# Patient Record
Sex: Female | Born: 1995
Health system: Southern US, Community
[De-identification: ages and names within clinical notes are randomized; demographics above are authoritative.]

## PROBLEM LIST (undated history)

## (undated) DIAGNOSIS — G43909 Migraine, unspecified, not intractable, without status migrainosus: Secondary | ICD-10-CM

## (undated) DIAGNOSIS — B54 Unspecified malaria: Secondary | ICD-10-CM

## (undated) HISTORY — PX: WISDOM TOOTH EXTRACTION: SHX21

## (undated) HISTORY — DX: Unspecified malaria: B54

## (undated) HISTORY — DX: Migraine, unspecified, not intractable, without status migrainosus: G43.909

---
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
## 2015-03-05 ENCOUNTER — Ambulatory Visit: Payer: Self-pay | Admitting: Pediatrics

## 2015-03-12 ENCOUNTER — Encounter: Payer: Self-pay | Admitting: Pediatrics

## 2015-03-12 ENCOUNTER — Ambulatory Visit (INDEPENDENT_AMBULATORY_CARE_PROVIDER_SITE_OTHER): Payer: Federal, State, Local not specified - PPO | Admitting: Licensed Clinical Social Worker

## 2015-03-12 ENCOUNTER — Ambulatory Visit (INDEPENDENT_AMBULATORY_CARE_PROVIDER_SITE_OTHER): Payer: Federal, State, Local not specified - PPO | Admitting: Pediatrics

## 2015-03-12 ENCOUNTER — Encounter (INDEPENDENT_AMBULATORY_CARE_PROVIDER_SITE_OTHER): Payer: Self-pay

## 2015-03-12 VITALS — BP 116/70 | Ht 61.0 in | Wt 157.4 lb

## 2015-03-12 DIAGNOSIS — IMO0001 Reserved for inherently not codable concepts without codable children: Secondary | ICD-10-CM

## 2015-03-12 DIAGNOSIS — F439 Reaction to severe stress, unspecified: Secondary | ICD-10-CM

## 2015-03-12 DIAGNOSIS — R030 Elevated blood-pressure reading, without diagnosis of hypertension: Secondary | ICD-10-CM

## 2015-03-12 DIAGNOSIS — Z658 Other specified problems related to psychosocial circumstances: Secondary | ICD-10-CM | POA: Diagnosis not present

## 2015-03-12 DIAGNOSIS — E663 Overweight: Secondary | ICD-10-CM

## 2015-03-12 LAB — POCT URINALYSIS DIPSTICK
BILIRUBIN UA: NEGATIVE
GLUCOSE UA: NEGATIVE
Ketones, UA: NEGATIVE
Nitrite, UA: NEGATIVE
PH UA: 5
Protein, UA: NEGATIVE
SPEC GRAV UA: 1.02
Urobilinogen, UA: NEGATIVE

## 2015-03-12 NOTE — Patient Instructions (Addendum)
Melissa Perkins please call the nutrition department if you are interested in talking to a nutritionist about dietary changes to keep your blood pressure normal. Their number is: [phone].  DASH Eating Plan DASH stands for "Dietary Approaches to Stop Hypertension." The DASH eating plan is a healthy eating plan that has been shown to reduce high blood pressure (hypertension). Additional health benefits may include reducing the risk of type 2 diabetes mellitus, heart disease, and stroke. The DASH eating plan may also help with weight loss.  WHAT DO I NEED TO KNOW ABOUT THE DASH EATING PLAN? For the DASH eating plan, you will follow these general guidelines:  Choose foods with a percent daily value for sodium of less than 5% (as listed on the food label).  Use salt-free seasonings or herbs instead of table salt or sea salt.  Check with your health care provider or pharmacist before using salt substitutes.  Eat lower-sodium products, often labeled as "lower sodium" or "no salt added."  Eat fresh foods.  Eat more vegetables, fruits, and low-fat dairy products.  Choose whole grains. Look for the word "whole" as the first word in the ingredient list.  Choose fish and skinless chicken or Malaw[place]urkey more often than red meat. Limit fish, poultry, and meat to 6 oz (170 g) each day.  Limit sweets, desserts, sugars, and sugary drinks.  Choose heart-healthy fats.  Limit cheese to 1 oz (28 g) per day.  Eat more home-cooked food and less restaurant, buffet, and fast food.  Limit fried foods.  Cook foods using methods other than frying.  Limit canned vegetables. If you do use them, rinse them well to decrease the sodium.  When eating at a restaurant, ask that your food be prepared with less salt, or no salt if possible. WHAT FOODS CAN I EAT? Seek help from a dietitian for individual calorie needs. Grains Whole grain or whole wheat bread. Brown rice. Whole grain or whole wheat pasta. Quinoa, bulgur,  and whole grain cereals. Low-sodium cereals. Corn or whole wheat flour tortillas. Whole grain cornbread. Whole grain crackers. Low-sodium crackers. Vegetables Fresh or frozen vegetables (raw, steamed, roasted, or grilled). Low-sodium or reduced-sodium tomato and vegetable juices. Low-sodium or reduced-sodium tomato sauce and paste. Low-sodium or reduced-sodium canned vegetables.  Fruits All fresh, canned (in natural juice), or frozen fruits. Meat and Other Protein Products Ground beef (85% or leaner), grass-fed beef, or beef trimmed of fat. Skinless chicken or Mal[place]urkey. Ground chicken or [place]urkey. Pork trimmed of fat. All fish and seafood. Eggs. Dried beans, peas, or lentils. Unsalted nuts and seeds. Unsalted canned beans. Dairy Low-fat dairy products, such as skim or 1% milk, 2% or reduced-fat cheeses, low-fat ricotta or cottage cheese, or plain low-fat yogurt. Low-sodium or reduced-sodium cheeses. Fats and Oils Tub margarines without trans fats. Light or reduced-fat mayonnaise and salad dressings (reduced sodium). Avocado. Safflower, olive, or canola oils. Natural peanut or almond butter. Other Unsalted popcorn and pretzels. The items listed above may not be a complete list of recommended foods or beverages. Contact your dietitian for more options.   WHAT FOODS ARE NOT RECOMMENDED?  Grains White bread. White pasta. White rice. Refined cornbread. Bagels and croissants. Crackers that contain trans fat. Vegetables Creamed or fried vegetables. Vegetables in a cheese sauce. Regular canned vegetables. Regular canned tomato sauce and paste. Regular tomato and vegetable juices. Fruits Dried fruits. Canned fruit in light or heavy syrup. Fruit juice. Meat and Other Protein Products Fatty cuts of meat. Ribs, chicken wings, bacon, sausage,  bologna, salami, chitterlings, fatback, hot dogs, bratwurst, and packaged luncheon meats. Salted nuts and seeds. Canned beans with salt. Dairy Whole or 2% milk,  cream, half-and-half, and cream cheese. Whole-fat or sweetened yogurt. Full-fat cheeses or blue cheese. Nondairy creamers and whipped toppings. Processed cheese, cheese spreads, or cheese curds. Condiments Onion and garlic salt, seasoned salt, table salt, and sea salt. Canned and packaged gravies. Worcestershire sauce. Tartar sauce. Barbecue sauce. Teriyaki sauce. Soy sauce, including reduced sodium. Steak sauce. Fish sauce. Oyster sauce. Cocktail sauce. Horseradish. Ketchup and mustard. Meat flavorings and tenderizers. Bouillon cubes. Hot sauce. Tabasco sauce. Marinades. Taco seasonings. Relishes. Fats and Oils Butter, stick margarine, lard, shortening, ghee, and bacon fat. Coconut, palm kernel, or palm oils. Regular salad dressings. Other Pickles and olives. Salted popcorn and pretzels. The items listed above may not be a complete list of foods and beverages to avoid. Contact your dietitian for more information. WHERE CAN I FIND MORE INFORMATION? National Heart, Lung, and Blood Institute: CablePromo.itwww.nhlbi.nih.gov/health/health-topics/topics/dash/ Document Released: 10/01/2011 Document Revised: 02/26/2014 Document Reviewed: 08/16/2013 [facility] Patient Information 2015 [place], [place]. This information is not intended to replace advice given to you by your health care provider. Make sure you discuss any questions you have with your health care provider.

## 2015-03-12 NOTE — BH Specialist Note (Signed)
Referring Provider: Venia MinksSIMHA,SHRUTI VIJAYA, MD Session Time:  10:57 - 12:20 (23 min) Type of Service: Behavioral Health - Individual/Family Interpreter: No.  Interpreter Name & Language: NA   PRESENTING CONCERNS:  [name] is a 19 y.o. female brought in by father. [name] [facility] some anxious symptoms affecting her health including blood pressure. Additionally, Melissa Perkins is learning to drive and is going away to college in the fall and she reported some stress with these events.   GOALS ADDRESSED:  Increase knowledge of coping skills and problem solving skills Enhance ability to effectively cope with the full variety of life's anxieties     INTERVENTIONS:  Assessed current condition/needs Built rapport Solution Focused Therapy Observed parent-child interaction Stress managment    ASSESSMENT/OUTCOME:  Melissa Perkins is smiling and calm today. She admits stress when driving and with going away to college. Dad is in agreement and shared an insight about dad's role in stress while driving car. Praised for insight. SFT used to look for previous skills. Melissa Perkins tends to break problems down into smaller pieces and proceed methodically to accomplish goals. She easily applied this idea to learning to drive a car. Facilitated conversation with Melissa Perkins and her dad about the two of them driving together. Each was able to share their feelings. Practiced deep breathing, family stated relief.    PLAN:  Melissa Perkins will try relaxation strategies as needed. She can focus on one goal a week to improve driving without feeling overwhelmed (this week is slower, more gentle stops). Visualize this change in the car before you even start the car. Melissa Perkins can call as needed. She voiced agreement.   Scheduled next visit: None at this time.  Melissa Perkins Melissa Perkins Melissa Perkins LCSWA Behavioral Health Clinician [facility]

## 2015-03-12 NOTE — Progress Notes (Signed)
    Subjective:    Melissa Perkins is a 19 y.o. female accompanied by father presenting to the clinic today for follow up of obesity and hypertension.  Reviewed school BP readings for the past 2 months- range from 109/74- 139/81. There was one reading of 151/81 on 02/05/15. Melissa Perkins reports she was testing that day & was anxious. It seems like her BP is elevated every time she is anxious. She has not incorporated a lot of changes in her lifestyle though she reports that she is trying to eat more salads. She does not exercise. Her weight has increased from 155 lbs to 157 lbs in the past 3 months & she has not yet been seen by the nutritionist as her insurance has lapsed. Melissa Perkins will be graduating high school & starting college this Fall. She is going to [employer] & plans to purse environmental science. She is motivated to make changes in her diet & lifestyle as end of school year is approaching.  Review of Systems  Constitutional: Negative for fever, activity change and appetite change.  HENT: Negative for congestion.   Eyes: Negative for pain.  Respiratory: Negative for cough.   Gastrointestinal: Negative for abdominal pain.  Skin: Negative for rash.  Neurological: Negative for light-headedness and headaches.  Psychiatric/Behavioral: Negative for sleep disturbance.        Objective:   Physical Exam  Constitutional: She appears well-nourished. No distress.  HENT:  Head: Normocephalic and atraumatic.  Right Ear: External ear normal.  Left Ear: External ear normal.  Nose: Nose normal.  Mouth/Throat: Oropharynx is clear and moist.  Eyes: Conjunctivae and EOM are normal. Right eye exhibits no discharge. Left eye exhibits no discharge.  Neck: Normal range of motion.  Cardiovascular: Normal rate, regular rhythm and normal heart sounds.   Pulmonary/Chest: No respiratory distress. She has no wheezes. She has no rales.  Skin: Skin is warm and dry. No rash noted.  Nursing note and vitals  reviewed.  .BP 116/70 mmHg  Ht 5\' 1"  (1.549 m)  Wt 157 lb 6.4 oz (71.396 kg)  BMI 29.76 kg/m2        Assessment & Plan:  Elevated BP & overweight  BP normal today.  Detailed discussion regarding lifestyle changes. DASH diet discussed & offered nutrition referral once her insurance is reinstated. [facility] Leta SpellerLauren Preston to help with coping strategies for stress.  - POCT urinalysis dipstick- normal.  Melissa Perkins with follow up with student health for BP checks. RTC in 6 months for PE.  The visit lasted for 25 minutes and > 50% of the visit time was spent on counseling regarding the treatment plan and importance of compliance with chosen management options.  Return in about 6 months (around 09/12/2015) for Well child with Dr Wynetta EmerySimha.  Tobey BrideShruti Ahmar Pickrell, MD 03/16/2015 1:00 PM

## 2015-05-24 ENCOUNTER — Telehealth: Payer: Self-pay | Admitting: Pediatrics

## 2015-05-24 NOTE — Telephone Encounter (Signed)
Please call Melissa Perkins as soon forms are ready she has 2 sets of forms one for school and one for college @ [phone]

## 2015-05-24 NOTE — Telephone Encounter (Signed)
RN received forms for bother siblings. Forms documented on by RN and immunization records attached. Forms placed in Providers forms folder to be completed and signed by MD.

## 2015-05-27 NOTE — Telephone Encounter (Signed)
Forms picked up from completed folder, copies of originals made and left with Med Records to scan and originals delivered to front desk.

## 2015-05-27 NOTE — Telephone Encounter (Signed)
I called Mrs, Greggs spoked to her and let her know that her forms are ready for pick up

## 2015-06-11 ENCOUNTER — Ambulatory Visit (INDEPENDENT_AMBULATORY_CARE_PROVIDER_SITE_OTHER): Payer: Federal, State, Local not specified - PPO | Admitting: Pediatrics

## 2015-06-11 VITALS — BP 133/86 | HR 80 | Temp 98.4°F | Ht 60.63 in | Wt 157.2 lb

## 2015-06-11 DIAGNOSIS — R1013 Epigastric pain: Secondary | ICD-10-CM | POA: Diagnosis not present

## 2015-06-11 DIAGNOSIS — M25579 Pain in unspecified ankle and joints of unspecified foot: Secondary | ICD-10-CM

## 2015-06-11 DIAGNOSIS — Z113 Encounter for screening for infections with a predominantly sexual mode of transmission: Secondary | ICD-10-CM

## 2015-06-11 LAB — POCT URINE PREGNANCY: PREG TEST UR: NEGATIVE

## 2015-06-11 MED ORDER — OMEPRAZOLE 20 MG PO CPDR
20.0000 mg | DELAYED_RELEASE_CAPSULE | Freq: Every day | ORAL | Status: DC
Start: 2015-06-11 — End: 2016-09-01

## 2015-06-11 NOTE — Progress Notes (Signed)
History was provided by the patient.  Melissa Perkins is a 19 y.o. female who is here for abdominal pain/nausea and headaches.    HPI:  Pt reports she has been exercising recently to lose weight and over the weekend developed a headache while running at 2pm that recurred the following day. That has resolved and not returned.  She also has developed some nausea and upper belly pain that feels similar to being hungry but does not go away when she eats. It comes and goes but does not seem to be worsened or improved by anything. No treatments tried. Mom is worried because older brother recently had appendicitis. Patient often has some constipation but last BM yesterday was soft and easy to pass. No vomiting or diarrhea. Uses ibuprofen rarely. No alcohol or caffeine. No dysuria, frequency, urgency, vaginal discharge.   The following portions of the patient's history were reviewed and updated as appropriate: allergies, current medications, past family history, past medical history, past social history, past surgical history and problem list.  Physical Exam:  BP 133/86 mmHg  Pulse 80  Temp(Src) 98.4 F (36.9 C)  Ht 5' 0.63" (1.54 m)  Wt 157 lb 3.2 oz (71.305 kg)  BMI 30.07 kg/m2  LMP 05/20/2015 (Approximate)  Blood pressure percentiles are 99% systolic and 98% diastolic based on 2000 NHANES data.  Patient's last menstrual period was 05/20/2015 (approximate).    General:   alert, cooperative and no distress     Skin:   normal  Oral cavity:   lips, mucosa, and tongue normal; teeth and gums normal  Eyes:   sclerae white, pupils equal and reactive  Ears:   normal bilaterally  Nose: clear, no discharge  Neck:  Neck appearance: Normal  Lungs:  clear to auscultation bilaterally  Heart:   regular rate and rhythm, S1, S2 normal, no murmur, click, rub or gallop   Abdomen:  normal findings: bowel sounds normal, no masses palpable and no organomegaly and abnormal findings:  mild epigastric tenderness,  no rebound or guarding  GU:  not examined  Extremities:   extremities normal, atraumatic, no cyanosis or edema  Neuro:  normal without focal findings, mental status, speech normal, alert and oriented x3, PERLA and muscle tone and strength normal and symmetric    Assessment/Plan: Abdominal pain: concern for PUD or gastritis, mild and short duration (4 days) - trial of omeprazole prn - avoid nsaids - f/u if ongoing and would consider H. pylori testing  - will check upreg and gc/ct although suspicion is low given location of discomfort and lack of associated GU symptoms  - Immunizations today: none  - Follow-up visit in 2 months for [facility], or sooner as needed.    Beverely Low, MD  06/11/2015

## 2015-06-11 NOTE — Patient Instructions (Signed)
Gastritis, Child °Stomachaches in children may come from gastritis. This is a soreness (inflammation) of the stomach lining. It can either happen suddenly (acute) or slowly over time (chronic). A stomach or duodenal ulcer may be present at the same time. °CAUSES  °Gastritis is often caused by an infection of the stomach lining by a bacteria called Helicobacter Pylori. (H. Pylori.) This is the usual cause for primary (not due to other cause) gastritis. Secondary (due to other causes) gastritis may be due to: °· Medicines such as aspirin, ibuprofen, steroids, iron, antibiotics and others. °· Poisons. °· Stress caused by severe burns, recent surgery, severe infections, trauma, etc. °· Disease of the intestine or stomach. °· Autoimmune disease (where the body's immune system attacks the body). °· Sometimes the cause for gastritis is not known. °SYMPTOMS  °Symptoms of gastritis in children can differ depending on the age of the child. School-aged children and adolescents have symptoms similar to an adult: °· Belly pain - either at the top of the belly or around the belly button. This may or may not be relieved by eating. °· Nausea (sometimes with vomiting). °· Indigestion. °· Decreased appetite. °· Feeling bloated. °· Belching. °Infants and young children may have: °· Feeding problems or decreased appetite. °· Unusual fussiness. °· Vomiting. °In severe cases, a child may vomit red blood or coffee colored digested blood. Blood may be passed from the rectum as bright red or black stools. °DIAGNOSIS  °There are several tests that your child's caregiver may do to make the diagnosis.  °· Tests for H. Pylori. (Breath test, blood test or stomach biopsy) °· A small tube is passed through the mouth to view the stomach with a tiny camera (endoscopy). °· Blood tests to check causes or side effects of gastritis. °· Stool tests for blood. °· Imaging (may be done to be sure some other disease is not present) °TREATMENT  °For gastritis  caused by H. Pylori, your child's caregiver may prescribe one of several medicine combinations. A common combination is called triple therapy (2 antibiotics and 1 proton pump inhibitor (PPI). PPI medicines decrease the amount of stomach acid produced). Other medicines may be used such as: °· Antacids. °· H2 blockers to decrease the amount of stomach acid. °· Medicines to protect the lining of the stomach. °For gastritis not caused by H. Pylori, your child's caregiver may: °· Use H2 blockers, PPI's, antacids or medicines to protect the stomach lining. °· Remove or treat the cause (if possible). °HOME CARE INSTRUCTIONS  °· Use all medicine exactly as directed. Take them for the full course even if everything seems to be better in a few days. °· Helicobacter infections may be re-tested to make sure the infection has cleared. °· Continue all current medicines. Only stop medicines if directed by your child's caregiver. °· Avoid caffeine. °SEEK MEDICAL CARE IF:  °· Problems are getting worse rather than better. °· Your child develops black tarry stools. °· Problems return after treatment. °· Constipation develops. °· Diarrhea develops. °SEEK IMMEDIATE MEDICAL CARE IF: °· Your child vomits red blood or material that looks like coffee grounds. °· Your child is lightheaded or blacks out. °· Your child has bright red stools. °· Your child vomits repeatedly. °· Your child has severe belly pain or belly tenderness to the touch - especially with fever. °· Your child has chest pain or shortness of breath. °Document Released: 12/21/2001 Document Revised: 01/04/2012 Document Reviewed: 06/18/2013 °ExitCare® Patient Information ©2015 ExitCare, LLC. This information is not   intended to replace advice given to you by your health care provider. Make sure you discuss any questions you have with your health care provider. ° °

## 2015-06-12 LAB — GC/CHLAMYDIA PROBE AMP, URINE
CHLAMYDIA, SWAB/URINE, PCR: NEGATIVE
GC PROBE AMP, URINE: NEGATIVE

## 2015-08-14 ENCOUNTER — Ambulatory Visit: Payer: Federal, State, Local not specified - PPO | Admitting: Pediatrics

## 2016-01-23 ENCOUNTER — Encounter: Payer: Self-pay | Admitting: Pediatrics

## 2016-01-23 ENCOUNTER — Ambulatory Visit (INDEPENDENT_AMBULATORY_CARE_PROVIDER_SITE_OTHER): Payer: Federal, State, Local not specified - PPO | Admitting: Pediatrics

## 2016-01-23 VITALS — BP 115/70 | Ht 60.5 in | Wt 159.8 lb

## 2016-01-23 DIAGNOSIS — Z68.41 Body mass index (BMI) pediatric, 85th percentile to less than 95th percentile for age: Secondary | ICD-10-CM

## 2016-01-23 DIAGNOSIS — Z113 Encounter for screening for infections with a predominantly sexual mode of transmission: Secondary | ICD-10-CM | POA: Diagnosis not present

## 2016-01-23 DIAGNOSIS — Z0001 Encounter for general adult medical examination with abnormal findings: Secondary | ICD-10-CM

## 2016-01-23 DIAGNOSIS — E663 Overweight: Secondary | ICD-10-CM | POA: Diagnosis not present

## 2016-01-23 DIAGNOSIS — R6889 Other general symptoms and signs: Secondary | ICD-10-CM

## 2016-01-23 NOTE — Patient Instructions (Addendum)
Kirsten you can return to see our adolescent specialist if you would like to consider contraception. I would also encourage you to start looking for a family practice or internal medicine doctor for your primary care  as we can see teens only until 21 yrs.   Websites for Teens  General www.youngwomenshealth.org www.youngmenshealthsite.org www.teenhealthfx.com www.teenhealth.org www.healthychildren.org  Sexual and Reproductive Health www.bedsider.org www.seventeendays.org www.plannedparenthood.org www.StrengthHappens.sisexetc.org www.girlology.com  Relaxation & Meditation Apps for Teens Mindshift StopBreatheThink Relax & Rest Smiling Mind Calm Headspace Take A Chill Kids Feeling SAM Freshmind Yoga By [employer] Kids Yogaverse  Teens need about 9 hours of sleep a night. Younger children need more sleep (10-11 hours a night) and adults need slightly less (7-9 hours each night).  11 Tips to Follow:  1. No caffeine after 3pm: Avoid beverages with caffeine (soda, tea, energy drinks, etc.) especially after 3pm. 2. Don't go to bed hungry: Have your evening meal at least 3 hrs. before going to sleep. It's fine to have a small bedtime snack such as a glass of milk and a few crackers but don't have a big meal. 3. Have a nightly routine before bed: Plan on "winding down" before you go to sleep. Begin relaxing about 1 hour before you go to bed. Try doing a quiet activity such as listening to calming music, reading a book or meditating. 4. Turn off the TV and ALL electronics including video games, tablets, laptops, etc. 1 hour before sleep, and keep them out of the bedroom. 5. Turn off your cell phone and all notifications (new email and text alerts) or even better, leave your phone outside your room while you sleep. Studies have shown that a part of your brain continues to respond to certain lights and sounds even while you're still asleep. 6. Make your bedroom quiet, dark and cool. If you can't control the  noise, try wearing earplugs or using a fan to block out other sounds. 7. Practice relaxation techniques. Try reading a book or meditating or drain your brain by writing a list of what you need to do the next day. 8. Don't nap unless you feel sick: you'll have a better night's sleep. 9. Don't smoke, or quit if you do. Nicotine, alcohol, and marijuana can all keep you awake. Talk to your health care provider if you need help with substance use. 10. Most importantly, wake up at the same time every day (or within 1 hour of your usual wake up time) EVEN on the weekends. A regular wake up time promotes sleep hygiene and prevents sleep problems. 11. Reduce exposure to bright light in the last three hours of the day before going to sleep. Maintaining good sleep hygiene and having good sleep habits lower your risk of developing sleep problems. Getting better sleep can also improve your concentration and alertness. Try the simple steps in this guide. If you still have trouble getting enough rest, make an appointment with your health care provider.

## 2016-01-23 NOTE — Progress Notes (Signed)
Adolescent Well Care Visit Melissa Perkins is a 20 y.o. female who is here for well care.    PCP:  Melissa MinksSIMHA,Tionna Gigante VIJAYA, MD   History was provided by the patient.  Current Issues: Current concerns include Doing well, no concerns [employer]man in [employer]. College has been very busy. She lives in a dorm on campus. No current health issues. She has checked her BP off & on & it has been normal. Labs in 2015 were wnl expect for borderline low Vit D- not taking vitamins .   Nutrition: Nutrition/Eating Behaviors: Attempts to cook when possible but mostly eats outside/cafeteria Adequate calcium in diet?: No Supplements/ Vitamins: No  Exercise/ Media: Play any Sports?/ Exercise: None Screen Time:  < 2 hours Media Rules or Monitoring?: no  Sleep:  Sleep: 5-6 hrs only at night due to school assignments  Social Screening: Lives alone in a dorm on school campus Parental relations:  good Activities, Work, and Regulatory affairs officerChores?: extremely busy with college Concerns regarding behavior with peers?  no Stressors of note: yes - lack of sleep & work load  Education: [employer]. Environmnetal science. Plans to transfer to [employer] or [employer] if possible for Public health School performance: doing well; no concerns  Menstruation:   Patient's last menstrual period was 01/01/2016. Menstrual History: Regular cycles every 30 days  Confidentiality was discussed with the patient and, if applicable, with caregiver as well.   Tobacco?  no Secondhand smoke exposure?  no Drugs/ETOH?  no  Sexually Active?  no   Pregnancy Prevention: abstinence. Pt is not interested in any form of contraception & strongly feels that she is responsible & when she decides  To become sexually active she will opt for birth control. She is not currently in a relationship & has never been sexually active.  Safe at home, in school & in relationships?  Yes Safe to self?  Yes   Screenings: Patient has a  dental home: yes  The patient completed the Rapid Assessment for Adolescent Preventive Services screening questionnaire and the following topics were identified as risk factors and discussed: healthy eating, exercise, condom use and birth control  In addition, the following topics were discussed as part of anticipatory guidance tobacco use, marijuana use and drug use.  PHQ-9 completed and results indicated negative screen  Physical Exam:  Filed Vitals:   01/23/16 0917  BP: 115/70  Height: 5' 0.5" (1.537 m)  Weight: 159 lb 12.8 oz (72.485 kg)   BP 115/70 mmHg  Ht 5' 0.5" (1.537 m)  Wt 159 lb 12.8 oz (72.485 kg)  BMI 30.68 kg/m2  LMP 01/01/2016 Body mass index: body mass index is 30.68 kg/(m^2). Blood pressure percentiles are 78% systolic and 75% diastolic based on 2000 NHANES data. Blood pressure percentile targets: 90: 120/77, 95: 124/81, 99 + 5 mmHg: 136/93.   Hearing Screening   Method: Audiometry   125Hz  250Hz  500Hz  1000Hz  2000Hz  4000Hz  8000Hz   Right ear:   20 20 20 20    Left ear:   20 20 20 20      Visual Acuity Screening   Right eye Left eye Both eyes  Without correction:     With correction: 20/20 20/20 20/20     General Appearance:   alert, oriented, no acute distress  HENT: Normocephalic, no obvious abnormality, conjunctiva clear  Mouth:   Normal appearing teeth, no obvious discoloration, dental caries, or dental caps  Neck:   Supple; thyroid: no enlargement, symmetric, no tenderness/mass/nodules  Chest Breast  if female: 5  Lungs:   Clear to auscultation bilaterally, normal work of breathing  Heart:   Regular rate and rhythm, S1 and S2 normal, no murmurs;   Abdomen:   Soft, non-tender, no mass, or organomegaly  GU normal female external genitalia, pelvic not performed  Musculoskeletal:   Tone and strength strong and symmetrical, all extremities               Lymphatic:   No cervical adenopathy  Skin/Hair/Nails:   Skin warm, dry and intact, no rashes, no bruises or  petechiae  Neurologic:   Strength, gait, and coordination normal and age-appropriate     Assessment and Plan:   20 y/o female for well visit Overweight  Detailed dietary advice/lifestyle changes discussed. 5210 Sleep hygiene discussed  Discussed options for contraception in detail but [name] declined for now.  BMI is not appropriate for age  Hearing screening result:normal Vision screening result: normal   Orders Placed This Encounter  Procedures  . GC/Chlamydia Probe Amp    Discussed transition to family practice/adult medicine.  Return in 1 year (on 01/22/2017).Melissa Minks, MD

## 2016-01-24 DIAGNOSIS — Z68.41 Body mass index (BMI) pediatric, 85th percentile to less than 95th percentile for age: Secondary | ICD-10-CM | POA: Insufficient documentation

## 2016-01-24 LAB — GC/CHLAMYDIA PROBE AMP
CT Probe RNA: NOT DETECTED
GC PROBE AMP APTIMA: NOT DETECTED

## 2016-09-01 ENCOUNTER — Encounter: Payer: Self-pay | Admitting: Pediatrics

## 2016-09-01 ENCOUNTER — Ambulatory Visit: Payer: Federal, State, Local not specified - PPO | Admitting: Pediatrics

## 2016-09-01 ENCOUNTER — Ambulatory Visit (INDEPENDENT_AMBULATORY_CARE_PROVIDER_SITE_OTHER): Payer: Federal, State, Local not specified - PPO | Admitting: Pediatrics

## 2016-09-01 ENCOUNTER — Ambulatory Visit (INDEPENDENT_AMBULATORY_CARE_PROVIDER_SITE_OTHER): Payer: Federal, State, Local not specified - PPO | Admitting: Clinical

## 2016-09-01 VITALS — BP 144/80 | Temp 98.9°F | Wt 157.4 lb

## 2016-09-01 DIAGNOSIS — R030 Elevated blood-pressure reading, without diagnosis of hypertension: Secondary | ICD-10-CM | POA: Diagnosis not present

## 2016-09-01 DIAGNOSIS — R69 Illness, unspecified: Secondary | ICD-10-CM

## 2016-09-01 DIAGNOSIS — F439 Reaction to severe stress, unspecified: Secondary | ICD-10-CM | POA: Diagnosis not present

## 2016-09-01 DIAGNOSIS — G44229 Chronic tension-type headache, not intractable: Secondary | ICD-10-CM

## 2016-09-01 NOTE — BH Specialist Note (Signed)
Session Start time: 12:23   End Time: 1:08 Total Time:  45 mins Type of Service: Behavioral Health - Individual/Family Interpreter: No.   Interpreter Name & LanguageGretta Cool: n/a [facility] July [facility]-June 2018: [facility]st   SUBJECTIVE: [name] is a 20 y.o. female brought in by self.  Pt./Family was referred by Dr. Kennedy BuckerGrant for:  anxiety. Pt./Family reports the following symptoms/concerns: Pt reports feeling anxiety and stress around school, home life, and work. Pt reports feeling overwhelmed at times, having diffculty concentrating, and having a difficult relationship with her mom. Pt feels disconnected from her peers, and reports feeling concerned about her school performance Duration of problem:  Has felt stress since she can remember, feelings have intensified since starting college about a year and a half ago Severity: pt reports on the border between moderate and severe, citing not being able to focus on herself and her life, as stress is impacting the things she wants to be doing Previous treatment: Pt reports talking to her brothers for advice, praying, going shopping, and other activities to distract herself from stressors. Is also interested in the counseling center at her college.   OBJECTIVE: Mood: Anxious and Euthymic & Affect: Appropriate Risk of harm to self or others: No, reports wondering why she is here, and what it would be like to not be here, but has no plan or intent Assessments administered: None, in the future, screen for depression, anxiety, panic disorder  LIFE CONTEXT:  Family & Social: Pt reports having recently been ina fight with mom, arguing about mom's behaviors. Pt reports a close relationship with her brothers and sister. Pt reports having associates at school, and some close friends at church that she doesn't get to see very often due to being at work on Sunday.  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 Works part time at new job of about a semester  Self-Care: No concerns reported about sleeping or eating, often uses sleep as a way to feel better when feeling stress. Reports no substance use  Life changes: Started college in the last few years, started a new job for the first time  What is important to pt/family (values): It is important to pt to focus on herself and her goals. Academic success is also important to pt.   GOALS ADDRESSED:  Assess barriers to social emotional development Reduce overall frequency, intensity, and duration of the anxiety so that daily functioning is not impaired Elevate mood and show evidence of usual energy, activities, and socialization level   INTERVENTIONS: Strength-based, Supportive, Reframing and Other: including  Build rapport Stress management Discussed Integrated Care Grounding technique   ASSESSMENT:  Pt/Family currently experiencing increased stress and anxiety in several areas of her life. Pt reports feeling stress around her home life, which is impacting her school performance, which causes her more stress. Pt is experiencing stress which she identifies is coming from several activities and much of the stress seems to be coming from her strained relationship with her mom.   Pt also reports symptoms similar to that of a panic or anxiety attack.   Pt/Family may benefit from further screening and investigation to help develop insight into pts experience. Pt may also benefit from continued reflective talk therapy and coping strategies from this Clinical research associatewriter. Pt would benefit from implementing grounding coping strategy when she begins feeling out of control or cannot focus. Pt  may also benefit from following up with her questions and interest in the counseling center on her college campus.      PLAN: 1. F/U with behavioral health clinician: 09/08/16 2. Behavioral recommendations: Implement coping  strategies when feeling unable to focus, overwhelmed, or stressed 3. Referral: Supportive Counseling on her college campus 4. From scale of 1-10, how likely are you to follow plan: 10, pt reports being really interested and motivated to make changes and learn skills to make herself feel better   Tim LairHannah Moore Behavioral Health Intern  Marlon PelWarmhandoff:   Warm Hand Off Completed.      I reviewed [facility] with [facility]. I concur with the treatment plan as documented in the [facility] note.  No charge for this visit due to [facility] completing [facility] visit.   Jasmine P. Mayford KnifeWilliams, MSW, LCSW Lead Behavioral Health Clinician

## 2016-09-01 NOTE — Progress Notes (Signed)
History was provided by the patient.  Melissa Perkins is a 20 y.o. female who is here for acute visit due to headache and concern for elevated blood pressure.      HPI:   Complaining of headache for the past 5 days- frontal in nature and throbbing; intermittent and associated with phonophobia and nausea.  Denies any neck pain, diplopia, vomiting, fever or recent illness. Has used Tylenol with some relief. Headaches seem to be related to stress.  Patient identifies stressors of school- exams coming up and with family- Mom needed help with younger siblings.  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  She is now living back at home but previously lived on campus.  Has headaches 3 per month but during exams and high stress times thinks she may have as many as 15 per month. Has never kept track of them.  Is also concerned that stress and headaches are causing increased blood pressure for her.   Periods have been regular and non heavy.   Denies any family history of HTN or migraines or renovascular disease.    Melissa Perkins has seen BH in past and identifies today that she may be in need of counseling to control some of her symptoms.  Review of Systems  Constitutional: Negative for chills, fever, malaise/fatigue and weight loss.  HENT: Negative for congestion, ear pain, sinus pain and sore throat.        Phonphobia  Eyes: Negative for blurred vision, double vision and photophobia.  Respiratory: Negative for cough and shortness of breath.   Cardiovascular: Negative for chest pain and palpitations.  Gastrointestinal: Positive for nausea. Negative for abdominal pain, constipation, diarrhea and vomiting.  Genitourinary: Negative for dysuria and flank pain.  Musculoskeletal: Negative for back pain, myalgias and neck pain.  Neurological: Positive for headaches. Negative for dizziness, focal weakness, seizures, loss of consciousness and  weakness.  Psychiatric/Behavioral: The patient is nervous/anxious.        Stress    The following portions of the patient's history were reviewed and updated as appropriate: allergies, current medications, past family history, past medical history, past social history, past surgical history and problem list.  Physical Exam:  BP (!) 144/80   Temp 98.9 F (37.2 C) (Temporal)   Wt 157 lb 6.4 oz (71.4 kg)   BMI 30.23 kg/m   Vitals with Age-Percentiles 09/01/2016 01/23/2016 06/11/2015 03/12/2015  Height percentile  6.9 7.8 10.2  Systolic percentile  77.8 99.1 ((!)) 75.9  Diastolic percentile  74.5 97.8 ((!)) 69.9  Length  153.7 cm 154 cm 154.9 cm  Systolic 144 115 454133 116  Diastolic 80 70 86 70  Pulse   80   Respiration      Weight 71.396 kg 72.485 kg 71.305 kg 71.396 kg  BMI  30.8 30.1 29.8  VISIT REPORT       Vitals with Age-Percentiles 11/27/2014 11/27/2014 07/31/2014 03/24/2013  Height percentile  6.8 5.1 11.4  Systolic percentile 70.1 96.9 ((!)) 96.9 ((!)) 97.6 ((!))  Diastolic percentile 85.4 89.2 09.891.9 67.1  Length  153.5 cm 152.5 cm 154.9 cm  Systolic 114 128 119128 130  Diastolic 76 78 80 70  Pulse    72  Respiration    16  Weight  70.308 kg 69.31 kg 68.04 kg  BMI  29.9 29.9 28.4  VISIT REPORT       Vitals with Age-Percentiles 12/06/2012 08/30/2012 05/26/2012 01/18/2012  Height percentile  5.9  6.7  Systolic percentile  98.8 ((!))  98.2 ((!))  Diastolic percentile  88.7  81.0  Length  152.4 cm  152.4 cm  Systolic 140 132 409120 130  Diastolic 82 78 90 74  Pulse 80 91 84 80  Respiration 16  16 16   Weight 64.411 kg 60.782 kg 62.143 kg 66.679 kg  BMI  26.2  28.8  VISIT REPORT       Vitals with Age-Percentiles 12/17/2009  Height percentile 33.0  Systolic percentile 94.7  Diastolic percentile 52.3  Length 154.9 cm  Systolic 124  Diastolic 64  Pulse 86  Respiration   Weight 57.153 kg  BMI 23.9  VISIT REPORT    General:  Alert, cooperative, no distress Head:  Normocephalic  with no palpable pain.  Eyes:  PERRL, conjunctivae clear, both eyes, vessels seen on exam without photophobia.  Ears:  Normal TMs and external ear canals, both ears Nose:  Nares normal, no drainage Throat: Oropharynx pink, moist, benign Neck:  Supple Chest Wall: No tenderness or deformity Cardiac: Regular rate and rhythm, S1 and S2 normal, no murmur, rub or gallop, 2+ radial and distal pulses Lungs: Clear to auscultation bilaterally, respirations unlabored Abdomen: Soft, non-tender, non-distended, bowel sounds active all four quadrants, no masses, no organomegaly Extremities: Extremities normal, no deformities, no cyanosis or edema; hips stable and symmetric bilaterally Skin: Warm, dry, clear Neurologic: Nonfocal, normal tone, normal reflexes   Assessment/Plan: Melissa Perkins is a 20 yo F with previous history of headaches and stress who presents with headache intermittently for four days and concern for ongoing stress and elevated Blood pressures.  Chart review of blood pressures notes last elevated BP in 2014 with multiple subsequent Bps within normal limits until today.  Discussed that headaches were less likely due to organic renovascular cause but that this could not be ruled out.  Most likely tension type headache/ migraine.   Discussed differentials of essential hypertension vs renal disease vs pheochromocytomas.  Melissa Perkins realized that stress and lack of coping was really her largest complaint and contributor to somatic symptoms and was interested in trying to manage this better before formal work up.   Melissa Perkins was referred to behavioral health today and was seen by them during this visit.  She will follow up with school counseling services [facility] [employer] as well.    For headaches she will keep a headache diary on her phone for the next one month. She will continue Tylenol as needed. Advised against NSAID use in light of elevated Blood Pressure today. Continue relaxation techniques and  drink plenty of fluids.   For Blood pressure we will recheck in one month and decide whether referral is appropriate.   Melissa Perkins declined influenza vaccination today.  Spent 25 minutes with patient with >50% time spent counseling regarding differential diagnosis, stress modification and relaxation.   Melissa LinseyKhalia L Peggye Poon, MD  09/01/16

## 2016-09-01 NOTE — Patient Instructions (Signed)
Keep headache diary over the next month with symptoms and relief of symptoms.   Please follow up with behavioral health as scheduled and your school counseling department  We will see you in one month to recheck blood pressure and

## 2016-09-08 ENCOUNTER — Telehealth: Payer: Self-pay | Admitting: Clinical

## 2016-09-08 ENCOUNTER — Ambulatory Visit: Payer: Self-pay

## 2016-09-08 NOTE — Telephone Encounter (Signed)
Called to reschedule rescheduled meeting, as this writer will not be in the clinic at new appointment time. LVM asking pt to call back when convenient.

## 2016-09-16 ENCOUNTER — Ambulatory Visit: Payer: Federal, State, Local not specified - PPO

## 2016-09-21 ENCOUNTER — Ambulatory Visit (INDEPENDENT_AMBULATORY_CARE_PROVIDER_SITE_OTHER): Payer: Federal, State, Local not specified - PPO | Admitting: Clinical

## 2016-09-21 DIAGNOSIS — R69 Illness, unspecified: Secondary | ICD-10-CM

## 2016-09-21 NOTE — BH Specialist Note (Signed)
Session Start time: 2:48   End Time: 3:47 Total Time:  59 mins Type of Service: Behavioral Health - Individual/Family Interpreter: No.   Interpreter Name & LanguageGretta Cool: n/a [facility] July 2017-June 2018: 2nd   SUBJECTIVE: [name] is a 20 y.o. female brought in by self.  Pt./Family was referred by Dr. Kennedy BuckerGrant for:  anxiety. Pt./Family reports the following symptoms/concerns: Pt reports feeling anxiety and stress around school, home life, and work. Pt reports feeling overwhelmed at times, having diffculty concentrating, and having a difficult relationship with her mom. Pt feels disconnected from her peers, and reports feeling concerned about her school performance Duration of problem:  Has felt stress since she can remember, feelings have intensified since starting college about a year and a half ago Severity: pt reports on the border between moderate and severe, citing not being able to focus on herself and her life, as stress is impacting the things she wants to be doing Previous treatment: Pt reports talking to her brothers for advice, praying, going shopping, and other activities to distract herself from stressors. Pt has made an appointment for this week to be seen at her college counseling center. Pt has seen BH at this clinic previously for brief intervention coping skills.   OBJECTIVE: Mood: Anxious and Euthymic & Affect: Appropriate Risk of harm to self or others: No, reports wondering why she is here, and what it would be like to not be here, but has no plan or intent Assessments administered: PHQ full version PHQ-SADS (Patient Health Questionnaire- Somatic, Anxiety, and Depressive Symptoms) This is an evidence based assessment tool for depression, anxiety, and somatic symptoms in adolescents and adults. It includes the PHQ-9, GAD-7, and PHQ-15, plus panic measures. Score cut-off points for each section are as follows: 5-9: Mild, 10-14: Moderate, 15+: Severe  Section A: PHQ-15 for  Somatic Complaints =  2  Section B: GAD-7 for Anxiety = 6  Section C: Anxiety Attacks = No Section D: PHQ-9 for Depression = 6   How difficult have these problems made it for you to do your work, take care of things at home, or get along with other people? Extremely Difficult   LIFE CONTEXT:  Family & Social: Pt reports recent stressor and arguments with mom about mom's behaviors. Pt reports a close relationship with her brothers and sister. Pt reports having associates at school, and some close friends at church that she doesn't get to see very often due to being at work on Sunday.  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 Works part time at new job of about a semester  Self-Care: No concerns reported about sleeping or eating, often uses sleep as a way to feel better when feeling stress. Reports no substance use  Life changes: Started college in the last few years, started a new job for the first time, recently moved back home from living on campus  What is important to pt/family (values): It is important to pt to focus on herself and her goals. Academic success is also important to pt.   GOALS ADDRESSED:  Assess barriers to social emotional development Reduce overall frequency, intensity, and duration of the anxiety so that daily functioning is not impaired Elevate mood and show evidence of usual energy, activities, and socialization level   INTERVENTIONS: Strength-based, Supportive, Reframing and Other: including  Build rapport Stress management Breathing exercise (both box  breathing and 4-7-8 breathing)   ASSESSMENT:  Pt/Family currently experiencing increased stress and anxiety in several areas of her life. Pt reports feeling stress around her home life, which is impacting her school performance, which causes her more stress. Pt is experiencing stress which  she identifies is coming from several activities and much of the stress seems to be coming from her strained relationship with her mom.    Pt/Family may benefit from further screening and investigation to help develop insight into pts experience. Pt would benefit from continuing to practice grounding coping strategy when she begins feeling out of control or cannot focus. Pt may also benefit from implementing deep breathing strategy when feeling anxious and nauseous. Pt may also benefit from keeping her appointment with the counseling center on her college campus.     PLAN: 1. F/U with behavioral health clinician: None at this time, has gotten connected with counseling on college campus 2. Behavioral recommendations: Implement grounding and breathing coping strategies when feeling unable to focus, overwhelmed, or stressed 3. Referral: Supportive Counseling on her college campus 4. From scale of 1-10, how likely are you to follow plan: 10, pt reports being really interested and motivated to make changes and learn skills to make herself feel better     Tim LairHannah Moore Behavioral Health Intern  Warmhandoff: No

## 2017-02-09 DIAGNOSIS — J3089 Other allergic rhinitis: Secondary | ICD-10-CM | POA: Diagnosis not present

## 2017-03-31 DIAGNOSIS — H04123 Dry eye syndrome of bilateral lacrimal glands: Secondary | ICD-10-CM | POA: Diagnosis not present

## 2017-03-31 DIAGNOSIS — H40013 Open angle with borderline findings, low risk, bilateral: Secondary | ICD-10-CM | POA: Diagnosis not present

## 2017-03-31 DIAGNOSIS — H5213 Myopia, bilateral: Secondary | ICD-10-CM | POA: Diagnosis not present

## 2017-09-03 ENCOUNTER — Telehealth: Payer: Self-pay

## 2017-09-03 NOTE — Telephone Encounter (Signed)
Called number listed as preferred and it is not correct. Will note this in demographics.  Left another Vm on the mobile number.

## 2017-09-03 NOTE — Telephone Encounter (Signed)
Patient left message on nurse line 09/02/17 asking for advice on immunizations needed for travel. I returned call to number provided and left message on generic VM asking her to call [facility]. When she returns call, resources include [facility] [phone] and [facility] website DebateUs.[email or web address].cdc.gov/travel or phone number [phone].

## 2017-09-06 NOTE — Telephone Encounter (Signed)
Patient called back and received information per L. Farrell's note.  Appreciated the information and will call back if needs a physical.

## 2017-09-20 ENCOUNTER — Encounter: Payer: Self-pay | Admitting: Pediatrics

## 2017-09-20 ENCOUNTER — Ambulatory Visit (INDEPENDENT_AMBULATORY_CARE_PROVIDER_SITE_OTHER): Payer: Federal, State, Local not specified - PPO | Admitting: Pediatrics

## 2017-09-20 ENCOUNTER — Ambulatory Visit (INDEPENDENT_AMBULATORY_CARE_PROVIDER_SITE_OTHER): Payer: Federal, State, Local not specified - PPO | Admitting: Licensed Clinical Social Worker

## 2017-09-20 VITALS — BP 112/70 | HR 83 | Ht 59.6 in | Wt 169.4 lb

## 2017-09-20 DIAGNOSIS — Z23 Encounter for immunization: Secondary | ICD-10-CM

## 2017-09-20 DIAGNOSIS — Z0001 Encounter for general adult medical examination with abnormal findings: Secondary | ICD-10-CM

## 2017-09-20 DIAGNOSIS — Z113 Encounter for screening for infections with a predominantly sexual mode of transmission: Secondary | ICD-10-CM

## 2017-09-20 DIAGNOSIS — E663 Overweight: Secondary | ICD-10-CM

## 2017-09-20 DIAGNOSIS — Z68.41 Body mass index (BMI) pediatric, greater than or equal to 95th percentile for age: Secondary | ICD-10-CM | POA: Diagnosis not present

## 2017-09-20 DIAGNOSIS — Z7184 Encounter for health counseling related to travel: Secondary | ICD-10-CM

## 2017-09-20 DIAGNOSIS — Z7189 Other specified counseling: Secondary | ICD-10-CM | POA: Diagnosis not present

## 2017-09-20 DIAGNOSIS — R69 Illness, unspecified: Secondary | ICD-10-CM

## 2017-09-20 LAB — POCT RAPID HIV: Rapid HIV, POC: NEGATIVE

## 2017-09-20 NOTE — Progress Notes (Signed)
Adolescent Well Care Visit [name] is a 21 y.o. female who is here for well care.     PCP:  Marijo FileSimha, Shruti V, MD   History was provided by the patient and mother.  Confidentiality was discussed with the patient and, if applicable, with caregiver as well. Patient's personal or confidential phone number: [phone]   Current Issues: Current concerns include: Will be going out of country to [place], [place]. Will be leaving on November 04, 2017 to study abroad. Will be coming back in May. Will be getting the yellow fever vaccination at the health clinic in [place], [place].   Nutrition: Nutrition/Eating Behaviors: Normally eat on campus (pizza and cheeseburgers). Usually goes home on weekends and gets a home cooked meal Adequate calcium in diet?: Doesn't drink milk, eats dairy  Supplements/ Vitamins: Vit D daily   Exercise/ Media: Play any Sports?:  none Exercise:  Workout occasionally  Screen Time:  < 2 hours  Sleep:  Sleep: Get's between 4-5 hours of sleep daily. Takes naps in between classes.   Social Screening: Lives with:  Stays on campus.  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  Concerns regarding behavior with peers?  no Stressors of note: no  Education: School Name: [employer], studying environmental studies and public health. School Grade: Junior School performance: doing well; no concerns School Behavior: doing well; no concerns  Menstruation:   Patient's last menstrual period was 09/06/2017 (approximate). Menstrual History: Menarche was at age 89 yrs old. Periods occur once a month. Last for 4-7 days.    Patient has a dental home: yes   Confidential social history: Tobacco?  no Secondhand smoke exposure?  no Drugs/ETOH?  no  Sexually Active?    Pregnancy Prevention: N/A  Safe at home, in school & in relationships?  Yes Safe to self?  Yes   Screenings:  The patient completed the  Rapid Assessment for Adolescent Preventive Services screening questionnaire and the following topics were identified as risk factors and discussed: healthy eating and exercise  In addition, the following topics were discussed as part of anticipatory guidance healthy eating and exercise.  PHQ-9 completed and results indicated Score of 0. No concern for depression   Physical Exam:  Vitals:   09/20/17 1421 09/20/17 1515  BP: (!) 130/96 112/70  Pulse: 83   SpO2: 98%   Weight: 169 lb 6.4 oz (76.8 kg)   Height: 4' 11.6" (1.514 m)    BP 112/70   Pulse 83   Ht 4' 11.6" (1.514 m)   Wt 169 lb 6.4 oz (76.8 kg)   LMP 09/06/2017 (Approximate)   SpO2 98%   BMI 33.53 kg/m  Body mass index: body mass index is 33.53 kg/m. Growth percentile SmartLinks can only be used for patients less than 21 years old.   Hearing Screening   125Hz  250Hz  500Hz  1000Hz  2000Hz  3000Hz  4000Hz  6000Hz  8000Hz   Right ear:   20 20 20  20     Left ear:   20 20 20  20     Comments: She has popping in her left ear   Visual Acuity Screening   Right eye Left eye Both eyes  Without correction:     With correction: 20/20 20/20 20/16     Physical Exam GEN: Overweight, NAD HEENT:  Sclera clear. PERRLA. EOMI. Nares clear. Oropharynx non erythematous without lesions or exudates. Moist mucous membranes.  SKIN: No rashes or jaundice.  PULM:  Unlabored respirations.  Clear to auscultation bilaterally with no wheezes or crackles.  No accessory muscle use. CARDIO:  Regular rate and rhythm.  No murmurs.  2+ radial pulses GI:  Soft, non tender, non distended.  Normoactive bowel sounds.  GU: normal female external genitalia. EXT: Warm and well perfused. No cyanosis or edema.  NEURO: Alert and oriented. No obvious focal deficits.    Assessment and Plan:  Melissa Perkins is a 21 year old F here for well child check.  1. Encounter for general adult medical examination with abnormal findings - Flu Vaccine QUAD 36+ mos IM - [facility]  patient with handout on how to transition to adult/family practice.  - Patient and/or legal guardian verbally consented to meet with Behavioral Health Clinician about presenting concerns. - BMI is not appropriate for age - Hearing screening result:normal - Vision screening result: normal  Counseling provided for all of the vaccine components  Orders Placed This Encounter  Procedures  . C. trachomatis/N. gonorrhoeae RNA  . Flu Vaccine QUAD 36+ mos IM  . Comprehensive metabolic panel  . Lipid panel  . TSH  . T4, free  . VITAMIN D 25 Hydroxy (Vit-D Deficiency, Fractures)  . Hemoglobin A1c  . POCT Rapid HIV    2. Screening examination for venereal disease - POCT Rapid HIV - C. trachomatis/N. gonorrhoeae RNA  3. Overweight - Comprehensive metabolic panel - Lipid panel - TSH - T4, free - VITAMIN D 25 Hydroxy (Vit-D Deficiency, Fractures) - Hemoglobin A1c  4. Counseling about travel - Encouraged patient to keep appt with the travel clinic this Wednesday. Instructed pt to call clinic if she is not able to get all of her prophylactic medication and we can send them to the pharmacy.       Return in 1 year (on 09/20/2018), or if symptoms worsen or fail to improve.Melissa Perkins.  Melissa Sandiford, MD

## 2017-09-20 NOTE — BH Specialist Note (Signed)
Integrated Behavioral Health Initial Visit  MRN: [id number] Name: Melissa Perkins  Number of Integrated Behavioral Health Clinician visits:: 1/6 Session Start time: 3:15pm  Session End time: 3:25pm Total time: 10 minutes  Type of Service: Integrated Behavioral Health- Individual/Family Interpretor:No. Interpretor Name and Language: N/A   Warm Hand Off Completed.       SUBJECTIVE: Melissa Perkins is a 21 y.o. female accompanied by Mother Patient was referred by Dr. Zenda AlpersSawyer for support with transition to adult care.  Patient reports the following symptoms/concerns: Patient report interest in information for adult care.    OBJECTIVE: Mood: Euthymic and Affect: Appropriate Risk of harm to self or others: No plan to harm self or others  LIFE CONTEXT: Family and Social: Patient lives with mother School/Work: Patient attends [employer] Self-Care: Patient feels she is managing anxiety well with utilizing supports. Life Changes: Patient is preparing to study abroad in [place] in January 2019.   GOALS ADDRESSED: Increase knowledge of adequate resources to enhance well being for patient and family.    INTERVENTIONS: Interventions utilized: Psychoeducation and/or Health Education  Standardized Assessments completed: None  ASSESSMENT: Patient currently preparing to transition to adult care as she will be 21 years old next month. [facility] options with patient and family.  [facility] provided information on Adult primary care providers.    Patient may benefit from following up with adult primary care agency to enroll.  PLAN: 1. Follow up with behavioral health clinician on : As needed. 2. Behavioral recommendations: Follow up with adult primary care providers.  3. Referral(s): [facility] 4. "From scale of 1-10, how likely are you to follow plan?": Patient and mom agreed with plan.   Jashayla Glatfelter Prudencio BurlyP Jayah Balthazar, LCSWA

## 2017-09-20 NOTE — Patient Instructions (Addendum)
-   Keep appointment with the travel clinic this Wednesday. Call clinic if you are not able to get all of her prophylactic medication and we can send them to the pharmacy.   Look at zerotothree.org for lots of good ideas on how to help your baby develop.  The best website for information about children is CosmeticsCritic.siwww.healthychildren.org.  All the information is reliable and up-to-date.    At every age, encourage reading.  Reading with your child is one of the best activities you can do.   Use the [employer] library near your home and borrow books every week.  The [employer] library offers amazing FREE programs for children of all ages.  Just go to www.greensborolibrary.org   Call the main number [phone] before going to the Emergency Department unless it's a true emergency.  For a true emergency, go to the [facility].   When the clinic is closed, a nurse always answers the main number [phone] and a doctor is always available.    Clinic is open for sick visits only on Saturday mornings from 8:30AM to 12:30PM. Call first thing on Saturday morning for an appointment.

## 2017-09-21 LAB — COMPREHENSIVE METABOLIC PANEL
AG RATIO: 1.4 (calc) (ref 1.0–2.5)
ALBUMIN MSPROF: 4.8 g/dL (ref 3.6–5.1)
ALT: 13 U/L (ref 6–29)
Alkaline phosphatase (APISO): 57 U/L (ref 33–115)
BILIRUBIN TOTAL: 1.1 mg/dL (ref 0.2–1.2)
BUN: 8 mg/dL (ref 7–25)
CALCIUM: 9.4 mg/dL (ref 8.6–10.2)
CHLORIDE: 102 mmol/L (ref 98–110)
CO2: 20 mmol/L (ref 20–32)
Creat: 0.69 mg/dL (ref 0.50–1.10)
GLOBULIN: 3.4 g/dL (ref 1.9–3.7)
Glucose, Bld: 79 mg/dL (ref 65–99)
SODIUM: 132 mmol/L — AB (ref 135–146)
Total Protein: 8.2 g/dL — ABNORMAL HIGH (ref 6.1–8.1)

## 2017-09-21 LAB — TSH: TSH: 1.38 m[IU]/L

## 2017-09-21 LAB — T4, FREE: FREE T4: 1 ng/dL (ref 0.8–1.4)

## 2017-09-21 LAB — LIPID PANEL
Cholesterol: 178 mg/dL (ref <200)
HDL: 74 mg/dL (ref >50)
LDL CHOLESTEROL (CALC): 89 mg/dL
Non-HDL Cholesterol (Calc): 104 mg/dL (calc) (ref <130)
Total CHOL/HDL Ratio: 2.4 (calc) (ref <5.0)
Triglycerides: 68 mg/dL (ref <150)

## 2017-09-21 LAB — C. TRACHOMATIS/N. GONORRHOEAE RNA
C. TRACHOMATIS RNA, TMA: NOT DETECTED
N. gonorrhoeae RNA, TMA: NOT DETECTED

## 2017-09-21 LAB — VITAMIN D 25 HYDROXY (VIT D DEFICIENCY, FRACTURES)

## 2017-09-21 LAB — SPECIMEN COMPROMISED

## 2017-09-21 LAB — HEMOGLOBIN A1C
Hgb A1c MFr Bld: 5.2 % of total Hgb (ref <5.7)
MEAN PLASMA GLUCOSE: 103 (calc)
eAG (mmol/L): 5.7 (calc)

## 2017-11-05 ENCOUNTER — Telehealth: Payer: Self-pay | Admitting: Internal Medicine

## 2017-11-05 NOTE — Telephone Encounter (Signed)
Copied from CRM [phone]. Topic: Appointment Scheduling - Scheduling Inquiry for Clinic >> Nov 05, 2017  9:49 AM Oneal GroutSebastian, Jennifer S wrote: Requesting to re-establish care with Dr Posey ReaPlotnikov. Please advise.

## 2017-11-08 NOTE — Telephone Encounter (Signed)
OK. Thx

## 2017-11-11 NOTE — Telephone Encounter (Signed)
LVM to inform patient that Dr. Posey ReaPlotnikov would take her back as a patient. To please call the office back to set up a New patient appointment with him.

## 2017-11-16 NOTE — Telephone Encounter (Signed)
FYI

## 2017-11-16 NOTE — Telephone Encounter (Signed)
Mother called to let Dr Posey ReaPlotnikov know the pt is out of the country until June.

## 2018-04-15 DIAGNOSIS — H40013 Open angle with borderline findings, low risk, bilateral: Secondary | ICD-10-CM | POA: Diagnosis not present

## 2018-04-15 DIAGNOSIS — H5213 Myopia, bilateral: Secondary | ICD-10-CM | POA: Diagnosis not present

## 2018-04-15 DIAGNOSIS — H04123 Dry eye syndrome of bilateral lacrimal glands: Secondary | ICD-10-CM | POA: Diagnosis not present

## 2018-05-02 ENCOUNTER — Other Ambulatory Visit (INDEPENDENT_AMBULATORY_CARE_PROVIDER_SITE_OTHER): Payer: Federal, State, Local not specified - PPO

## 2018-05-02 ENCOUNTER — Ambulatory Visit: Payer: Federal, State, Local not specified - PPO | Admitting: Internal Medicine

## 2018-05-02 ENCOUNTER — Encounter: Payer: Self-pay | Admitting: Internal Medicine

## 2018-05-02 DIAGNOSIS — Z0000 Encounter for general adult medical examination without abnormal findings: Secondary | ICD-10-CM | POA: Diagnosis not present

## 2018-05-02 LAB — CBC WITH DIFFERENTIAL/PLATELET
BASOS ABS: 0.1 10*3/uL (ref 0.0–0.1)
Basophils Relative: 0.6 % (ref 0.0–3.0)
EOS ABS: 0.1 10*3/uL (ref 0.0–0.7)
Eosinophils Relative: 1 % (ref 0.0–5.0)
HCT: 37 % (ref 36.0–46.0)
Hemoglobin: 12.2 g/dL (ref 12.0–15.0)
LYMPHS ABS: 1.6 10*3/uL (ref 0.7–4.0)
Lymphocytes Relative: 18.9 % (ref 12.0–46.0)
MCHC: 33.1 g/dL (ref 30.0–36.0)
MCV: 90.3 fl (ref 78.0–100.0)
Monocytes Absolute: 0.5 10*3/uL (ref 0.1–1.0)
Monocytes Relative: 5.9 % (ref 3.0–12.0)
NEUTROS ABS: 6.2 10*3/uL (ref 1.4–7.7)
NEUTROS PCT: 73.6 % (ref 43.0–77.0)
PLATELETS: 256 10*3/uL (ref 150.0–400.0)
RBC: 4.1 Mil/uL (ref 3.87–5.11)
RDW: 17.1 % — ABNORMAL HIGH (ref 11.5–15.5)
WBC: 8.5 10*3/uL (ref 4.0–10.5)

## 2018-05-02 LAB — LIPID PANEL
CHOL/HDL RATIO: 2
Cholesterol: 144 mg/dL (ref 0–200)
HDL: 62 mg/dL (ref >39.00)
LDL CALC: 71 mg/dL (ref 0–99)
NonHDL: 81.65
TRIGLYCERIDES: 52 mg/dL (ref 0.0–149.0)
VLDL: 10.4 mg/dL (ref 0.0–40.0)

## 2018-05-02 LAB — URINALYSIS
BILIRUBIN URINE: NEGATIVE
HGB URINE DIPSTICK: NEGATIVE
KETONES UR: NEGATIVE
LEUKOCYTES UA: NEGATIVE
Nitrite: NEGATIVE
Specific Gravity, Urine: 1.01 (ref 1.000–1.030)
Total Protein, Urine: NEGATIVE
UROBILINOGEN UA: 0.2 (ref 0.0–1.0)
Urine Glucose: NEGATIVE
pH: 7.5 (ref 5.0–8.0)

## 2018-05-02 LAB — BASIC METABOLIC PANEL
BUN: 6 mg/dL (ref 6–23)
CHLORIDE: 103 meq/L (ref 96–112)
CO2: 26 meq/L (ref 19–32)
Calcium: 9.3 mg/dL (ref 8.4–10.5)
Creatinine, Ser: 0.72 mg/dL (ref 0.40–1.20)
GFR: 130.8 mL/min (ref >60.00)
GLUCOSE: 87 mg/dL (ref 70–99)
Potassium: 3.8 mEq/L (ref 3.5–5.1)
Sodium: 137 mEq/L (ref 135–145)

## 2018-05-02 LAB — HEPATIC FUNCTION PANEL
ALBUMIN: 4.2 g/dL (ref 3.5–5.2)
ALK PHOS: 50 U/L (ref 39–117)
ALT: 8 U/L (ref 0–35)
AST: 14 U/L (ref 0–37)
BILIRUBIN TOTAL: 0.8 mg/dL (ref 0.2–1.2)
Bilirubin, Direct: 0.1 mg/dL (ref 0.0–0.3)
Total Protein: 7.2 g/dL (ref 6.0–8.3)

## 2018-05-02 LAB — TSH: TSH: 0.63 u[IU]/mL (ref 0.35–4.50)

## 2018-05-02 NOTE — Progress Notes (Signed)
Subjective:  Patient ID: Melissa CenterKristin Ruffalo, female    DOB: 1996-01-21  Age: 22 y.o. MRN: [id number]  CC: No chief complaint on file.   HPI Melissa Perkins presents for a new pt to re-est PCP - well exam Came back from [place] 03/27/18. She was there since Jan 2019. She was on malaria proph with Malarone. She got sick w/malaria in April. She had a gastroenteritis in April. Lost 26 lbs in [place]  Outpatient Medications Prior to Visit  Medication Sig Dispense Refill  . calcium-vitamin D (RA OYSTER SHELL CALCIUM/D) 250-125 MG-UNIT tablet Take by mouth.     No facility-administered medications prior to visit.     ROS: Review of Systems  Constitutional: Negative for activity change, appetite change, chills, fatigue and unexpected weight change.  HENT: Negative for congestion, mouth sores and sinus pressure.   Eyes: Negative for visual disturbance.  Respiratory: Negative for cough and chest tightness.   Gastrointestinal: Negative for abdominal pain and nausea.  Genitourinary: Negative for difficulty urinating, frequency and vaginal pain.  Musculoskeletal: Negative for back pain and gait problem.  Skin: Negative for pallor and rash.  Neurological: Negative for dizziness, tremors, weakness, numbness and headaches.  Psychiatric/Behavioral: Negative for confusion, sleep disturbance and suicidal ideas.    Objective:  BP 114/80 (BP Location: Left Arm, Patient Position: Sitting, Cuff Size: Normal)   Pulse (!) 108   Temp 98.5 F (36.9 C) (Oral)   Ht 4' 11.6" (1.514 m)   Wt 144 lb (65.3 kg)   SpO2 94%   BMI 28.50 kg/m   BP Readings from Last 3 Encounters:  05/02/18 114/80  09/20/17 112/70  09/01/16 (!) 144/80    Wt Readings from Last 3 Encounters:  05/02/18 144 lb (65.3 kg)  09/20/17 169 lb 6.4 oz (76.8 kg)  09/01/16 157 lb 6.4 oz (71.4 kg) (85 %, Z= 1.06)*   * Growth percentiles are based on CDC (Girls, 2-20 Years) data.    Physical Exam  Constitutional: She appears well-developed.  No distress.  HENT:  Head: Normocephalic.  Right Ear: External ear normal.  Left Ear: External ear normal.  Nose: Nose normal.  Mouth/Throat: Oropharynx is clear and moist.  Eyes: Pupils are equal, round, and reactive to light. Conjunctivae are normal. Right eye exhibits no discharge. Left eye exhibits no discharge.  Neck: Normal range of motion. Neck supple. No JVD present. No tracheal deviation present. No thyromegaly present.  Cardiovascular: Normal rate, regular rhythm and normal heart sounds.  Pulmonary/Chest: No stridor. No respiratory distress. She has no wheezes.  Abdominal: Soft. Bowel sounds are normal. She exhibits no distension and no mass. There is no tenderness. There is no rebound and no guarding.  Musculoskeletal: She exhibits no edema or tenderness.  Lymphadenopathy:    She has no cervical adenopathy.  Neurological: She displays normal reflexes. No cranial nerve deficit. She exhibits normal muscle tone. Coordination normal.  Skin: No rash noted. No erythema.  Psychiatric: She has a normal mood and affect. Her behavior is normal. Judgment and thought content normal.   Looks well   Lab Results  Component Value Date   WBC 8.8 01/18/2012   HGB 12.2 01/18/2012   HCT 37.0 01/18/2012   PLT 218.0 01/18/2012   GLUCOSE 79 09/20/2017   CHOL 178 09/20/2017   TRIG 68 09/20/2017   HDL 74 09/20/2017   LDLCALC 89 09/20/2017   ALT 13 09/20/2017   AST CANCELED 09/20/2017   NA 132 (L) 09/20/2017   K CANCELED 09/20/2017  CL 102 09/20/2017   CREATININE 0.69 09/20/2017   BUN 8 09/20/2017   CO2 20 09/20/2017   TSH 1.38 09/20/2017   HGBA1C 5.2 09/20/2017    No results found.  Assessment & Plan:   There are no diagnoses linked to this encounter.   No orders of the defined types were placed in this encounter.    Follow-up: No follow-ups on file.  Sonda Primes, MD

## 2018-05-02 NOTE — Assessment & Plan Note (Addendum)
Pt came back from [place] 03/27/18. She was there since Jan 2019. She was on malaria proph with Malarone. She got sick w/malaria in April. She had a gastroenteritis in April. She had a URI  We discussed age appropriate health related issues, including available/recomended screening tests and vaccinations. We discussed a need for adhering to healthy diet and exercise. Labs were reviewed/ordered. All questions were answered. Age and sex related issues discussed (safe sex, seat belt use, etc.). Gardasil suggested, info given. Labs ordered. Pt declined HIV test.

## 2018-09-18 ENCOUNTER — Ambulatory Visit (HOSPITAL_COMMUNITY)
Admission: EM | Admit: 2018-09-18 | Discharge: 2018-09-18 | Disposition: A | Discharge status: Home / Self Care | Payer: Federal, State, Local not specified - PPO | Attending: Family Medicine | Admitting: Family Medicine

## 2018-09-18 ENCOUNTER — Encounter (HOSPITAL_COMMUNITY): Payer: Self-pay | Admitting: Emergency Medicine

## 2018-09-18 ENCOUNTER — Other Ambulatory Visit: Payer: Self-pay

## 2018-09-18 DIAGNOSIS — Z3202 Encounter for pregnancy test, result negative: Secondary | ICD-10-CM | POA: Diagnosis not present

## 2018-09-18 DIAGNOSIS — N939 Abnormal uterine and vaginal bleeding, unspecified: Secondary | ICD-10-CM

## 2018-09-18 LAB — POCT PREGNANCY, URINE: Preg Test, Ur: NEGATIVE

## 2018-09-18 MED ORDER — MEGESTROL ACETATE 40 MG PO TABS: 40.0000 mg | ORAL_TABLET | Freq: Two times a day (BID) | ORAL | 0 refills | Status: DC

## 2018-09-18 NOTE — ED Triage Notes (Signed)
Pt states she started an early period on Friday night.  She states the blood is a lighter red than usual.  She is also having some abdominal cramping, no different than the usual menstrual cramps she normally gets with her regular period.  She states she only needs to change her pad every 4 hours or so and it is not saturated.

## 2018-09-18 NOTE — Discharge Instructions (Addendum)
This irregular.  It is usually related to an anovulatory cycle.  We are giving you medicine to reestablish her normal hormonal cycle.  He should expect that the bleeding will stop in the next 24 hours.  Then expect the menstrual period 5 to 7 days after you finish the prescription that were providing you with today.

## 2018-09-18 NOTE — ED Provider Notes (Signed)
[facility]    CSN: [id number] Arrival date & time: 09/18/18  1204     History   Chief Complaint Chief Complaint  Patient presents with  . Vaginal Bleeding  . Abdominal Cramping    HPI Melissa Perkins is a 22 y.o. female.   This is the initial visit for this 22 year old woman. Pt states she started an early period on Friday night.  She states the blood is a lighter red than usual.  She is also having some abdominal cramping, no different than the usual menstrual cramps she normally gets with her regular period.  She states she only needs to change her pad every 4 hours or so and it is not saturated.  Patient denies any h/o sexual intercourse.  LNMP:  2 weeks ago.     History reviewed. No pertinent past medical history.  Patient Active Problem List   Diagnosis Date Noted  . Well adult exam 05/02/2018  . Body mass index, pediatric, 85th percentile to less than 95th percentile for age 55/31/2017  . Overweight 07/31/2014  . Elevated blood pressure 01/18/2012    Past Surgical History:  Procedure Laterality Date  . WISDOM TOOTH EXTRACTION      OB History   None      Home Medications    Prior to Admission medications   Medication Sig Start Date End Date Taking? Authorizing Provider  calcium-vitamin D (RA OYSTER SHELL CALCIUM/D) 250-125 MG-UNIT tablet Take by mouth.    [provider]  megestrol (MEGACE) 40 MG tablet Take 1 tablet (40 mg total) by mouth 2 (two) times daily. 09/18/18   Elvina Sidle, MD    Family History Family History  Problem Relation Age of Onset  . Hypertension Father     Social History Social History   Tobacco Use  . Smoking status: Never Smoker  . Smokeless tobacco: Never Used  Substance Use Topics  . Alcohol use: No  . Drug use: No     Allergies   Amoxicillin   Review of Systems Review of Systems   Physical Exam Triage Vital Signs ED Triage Vitals  Enc Vitals Group     BP 09/18/18 1322 136/87       Pulse Rate 09/18/18 1322 80     Resp 09/18/18 1322 18     Temp 09/18/18 1322 98.1 F (36.7 C)     Temp Source 09/18/18 1322 Oral     SpO2 09/18/18 1322 100 %     Weight --      Height --      Head Circumference --      Peak Flow --      Pain Score 09/18/18 1333 4     Pain Loc --      Pain Edu? --      Excl. in GC? --    No data found.  Updated Vital Signs BP 136/87 (BP Location: Left Arm)   Pulse 80   Temp 98.1 F (36.7 C) (Oral)   Resp 18   LMP  (LMP Unknown)   SpO2 100%   Visual Acuity  Physical Exam  Constitutional: She appears well-developed and well-nourished.  HENT:  Right Ear: External ear normal.  Left Ear: External ear normal.  Eyes: Conjunctivae are normal.  Neck: Normal range of motion. Neck supple.  Pulmonary/Chest: Effort normal.  Abdominal: Soft. Bowel sounds are normal. There is no tenderness.  Musculoskeletal: Normal range of motion.  Neurological: She is alert.  Psychiatric: She has a  normal mood and affect. Her behavior is normal.  Nursing note and vitals reviewed.    UC Treatments / Results  Labs (all labs ordered are listed, but only abnormal results are displayed) Labs Reviewed - No data to display  EKG None  Radiology No results found.  Procedures Procedures (including critical care time)  Medications Ordered in UC Medications - No data to display  Initial Impression / Assessment and Plan / UC Course  I have reviewed the triage vital signs and the nursing notes.  Pertinent labs & imaging results that were available during my care of the patient were reviewed by me and considered in my medical decision making (see chart for details).    Final Clinical Impressions(s) / UC Diagnoses   Final diagnoses:  Abnormal vaginal bleeding     Discharge Instructions     This irregular.  It is usually related to an anovulatory cycle.  We are giving you medicine to reestablish her normal hormonal cycle.  He should expect that the  bleeding will stop in the next 24 hours.  Then expect the menstrual period 5 to 7 days after you finish the prescription that were providing you with today.    ED Prescriptions    Medication Sig Dispense Auth. Provider   megestrol (MEGACE) 40 MG tablet Take 1 tablet (40 mg total) by mouth 2 (two) times daily. 14 tablet Elvina SidleLauenstein, Chelsie Burel, MD     Controlled Substance Prescriptions [place] Controlled Substance Registry consulted? Not Applicable   Elvina SidleLauenstein, Adar Rase, MD 09/18/18 1346

## 2019-01-16 ENCOUNTER — Encounter: Payer: Self-pay | Admitting: Obstetrics and Gynecology

## 2019-06-14 ENCOUNTER — Encounter: Payer: Self-pay | Admitting: Internal Medicine

## 2019-06-14 ENCOUNTER — Ambulatory Visit (INDEPENDENT_AMBULATORY_CARE_PROVIDER_SITE_OTHER): Payer: Federal, State, Local not specified - PPO | Admitting: Internal Medicine

## 2019-06-14 DIAGNOSIS — Z20828 Contact with and (suspected) exposure to other viral communicable diseases: Secondary | ICD-10-CM

## 2019-06-14 DIAGNOSIS — Z20822 Contact with and (suspected) exposure to covid-19: Secondary | ICD-10-CM

## 2019-06-14 DIAGNOSIS — G43909 Migraine, unspecified, not intractable, without status migrainosus: Secondary | ICD-10-CM | POA: Insufficient documentation

## 2019-06-14 DIAGNOSIS — G43809 Other migraine, not intractable, without status migrainosus: Secondary | ICD-10-CM | POA: Diagnosis not present

## 2019-06-14 DIAGNOSIS — R509 Fever, unspecified: Secondary | ICD-10-CM

## 2019-06-14 MED ORDER — SUMATRIPTAN SUCCINATE 100 MG PO TABS: 100.0000 mg | ORAL_TABLET | ORAL | 11 refills | Status: DC | PRN

## 2019-06-14 MED ORDER — SUMATRIPTAN SUCCINATE 100 MG PO TABS: ORAL_TABLET | ORAL | 11 refills | Status: DC

## 2019-06-14 NOTE — Assessment & Plan Note (Signed)
?   Viral illness, for COVID testing referral

## 2019-06-14 NOTE — Progress Notes (Signed)
Patient ID: Melissa Perkins, female   DOB: 15-Nov-1995, 23 y.o.   MRN: [id number]  Virtual Visit via Video Note  I connected with Melissa Perkins on 06/14/19 at  4:20 PM EDT by a video enabled telemedicine application and verified that I am speaking with the correct person using two identifiers.  Location: Patient: at home Provider: at office   I discussed the limitations of evaluation and management by telemedicine and the availability of in person appointments. The patient expressed understanding and agreed to proceed.  History of Present Illness: Here with onset HA 2 days ago for several hours, right frontal, throbbing, mod to severe, assoc with photophobia and n/v, followed by near immediate resolution of the HA.  Since then has had no further HA, but possible low grade temp 99.1, lightheaded at thiimes, and legs overall seem kind of sore.  No ST, cough, and Pt denies chest pain, increased sob or doe, wheezing, orthopnea, PND, increased LE swelling, palpitations, dizziness or syncope.  No known covid exposure but is asking for testing No past medical history on file. Past Surgical History:  Procedure Laterality Date  . WISDOM TOOTH EXTRACTION      reports that she has never smoked. She has never used smokeless tobacco. She reports that she does not drink alcohol or use drugs. family history includes Hypertension in her father. Allergies  Allergen Reactions  . Amoxicillin Rash   Current Outpatient Medications on File Prior to Visit  Medication Sig Dispense Refill  . calcium-vitamin D (RA OYSTER SHELL CALCIUM/D) 250-125 MG-UNIT tablet Take by mouth.    . megestrol (MEGACE) 40 MG tablet Take 1 tablet (40 mg total) by mouth 2 (two) times daily. 14 tablet 0   No current facility-administered medications on file prior to visit.     Observations/Objective: Alert, NAD, appropriate mood and affect, resps normal, cn 2-12 intact, moves all 4s, no visible rash or swelling Lab Results   Component Value Date   WBC 8.5 05/02/2018   HGB 12.2 05/02/2018   HCT 37.0 05/02/2018   PLT 256.0 05/02/2018   GLUCOSE 87 05/02/2018   CHOL 144 05/02/2018   TRIG 52.0 05/02/2018   HDL 62.00 05/02/2018   LDLCALC 71 05/02/2018   ALT 8 05/02/2018   AST 14 05/02/2018   NA 137 05/02/2018   K 3.8 05/02/2018   CL 103 05/02/2018   CREATININE 0.72 05/02/2018   BUN 6 05/02/2018   CO2 26 05/02/2018   TSH 0.63 05/02/2018   HGBA1C 5.2 09/20/2017   Assessment and Plan: See notes  Follow Up Instructions: See notes   I discussed the assessment and treatment plan with the patient. The patient was provided an opportunity to ask questions and all were answered. The patient agreed with the plan and demonstrated an understanding of the instructions.   The patient was advised to call back or seek an in-person evaluation if the symptoms worsen or if the condition fails to improve as anticipated.   Cathlean Cower, MD

## 2019-06-14 NOTE — Patient Instructions (Signed)
Please take all new medication as prescribed - the imitrex for HA  Please go to the [facility] site for COVID testing  Please continue all other medications as before, and refills have been done if requested.  Please have the pharmacy call with any other refills you may need.  Please keep your appointments with your specialists as you may have planned

## 2019-06-14 NOTE — Assessment & Plan Note (Signed)
Hx is c/w typical migraine, for imitrex prn,  to f/u any worsening symptoms or concerns

## 2019-06-15 ENCOUNTER — Other Ambulatory Visit: Payer: Self-pay

## 2019-06-15 DIAGNOSIS — R6889 Other general symptoms and signs: Secondary | ICD-10-CM | POA: Diagnosis not present

## 2019-06-15 DIAGNOSIS — Z20822 Contact with and (suspected) exposure to covid-19: Secondary | ICD-10-CM

## 2019-06-16 ENCOUNTER — Encounter: Payer: Self-pay | Admitting: Internal Medicine

## 2019-06-16 LAB — NOVEL CORONAVIRUS, NAA: SARS-CoV-2, NAA: NOT DETECTED

## 2019-07-10 ENCOUNTER — Other Ambulatory Visit: Payer: Self-pay

## 2019-07-13 ENCOUNTER — Encounter: Payer: Self-pay | Admitting: Obstetrics and Gynecology

## 2019-07-13 ENCOUNTER — Encounter

## 2019-07-14 ENCOUNTER — Other Ambulatory Visit (HOSPITAL_COMMUNITY)
Admission: RE | Admit: 2019-07-14 | Discharge: 2019-07-14 | Disposition: A | Discharge status: Home / Self Care | Payer: Federal, State, Local not specified - PPO | Source: Ambulatory Visit | Attending: Obstetrics and Gynecology | Admitting: Obstetrics and Gynecology

## 2019-07-14 ENCOUNTER — Encounter: Payer: Self-pay | Admitting: Obstetrics and Gynecology

## 2019-07-14 ENCOUNTER — Ambulatory Visit: Payer: Federal, State, Local not specified - PPO | Admitting: Obstetrics and Gynecology

## 2019-07-14 ENCOUNTER — Other Ambulatory Visit: Payer: Self-pay

## 2019-07-14 VITALS — BP 168/70 | HR 96 | Temp 98.1°F | Resp 14 | Ht 61.25 in | Wt 170.4 lb

## 2019-07-14 DIAGNOSIS — Z01419 Encounter for gynecological examination (general) (routine) without abnormal findings: Secondary | ICD-10-CM | POA: Diagnosis not present

## 2019-07-14 NOTE — Patient Instructions (Signed)

## 2019-07-14 NOTE — Progress Notes (Signed)
23 y.o. G0P0000 Single African American female here for annual exam.    States her menstruation was early in Nov. 2019 and July, 2020.  Patient was seen by urgent care in Nov. 2019 and treated with Megace for her abnormal uterine bleeding.  Otherwise her menses are monthly. No problems with heavy bleeding.  Mild cramping.   Not sexually active ever. Declines birth control.   Graduated from [employer].  PCP: Georgina QuintAleksei V. Plotnikov, MD    Patient's last menstrual period was 06/06/2019 (within days).     Period Cycle (Days): (depends on the month) Period Duration (Days): 3-5 Period Pattern: (!) Irregular Menstrual Flow: Light Menstrual Control: Maxi pad Menstrual Control Change Freq (Hours): 2-3 Dysmenorrhea: (!) Mild Dysmenorrhea Symptoms: Cramping     Sexually active: No.  The current method of family planning is abstinence.    Exercising: Yes.    running and squats Smoker:  no  Health Maintenance: Pap:  never History of abnormal Pap:  n/a TDaP:  03/24/13 Gardasil:   no HIV: never Hep C: never Screening Labs:  PCP   reports that she has never smoked. She has never used smokeless tobacco. She reports previous alcohol use. She reports that she does not use drugs.  Past Medical History:  Diagnosis Date  . Malaria   . Migraines    without aura    Past Surgical History:  Procedure Laterality Date  . WISDOM TOOTH EXTRACTION      No current outpatient medications on file.   No current facility-administered medications for this visit.     Family History  Problem Relation Age of Onset  . Hypertension Father   . Breast cancer Maternal Grandmother   . Prostate cancer Maternal Grandfather   . Colon cancer Maternal Grandfather   . Heart attack Paternal Grandmother   . Obesity Paternal Grandmother   . Stroke Paternal Grandfather   . Vascular Disease Paternal Grandfather     Review of Systems  Constitutional: Negative.   HENT: Negative.   Eyes: Negative.    Respiratory: Negative.   Cardiovascular: Negative.   Gastrointestinal: Negative.   Endocrine: Negative.   Genitourinary: Negative.   Musculoskeletal: Negative.   Skin: Negative.   Allergic/Immunologic: Negative.   Neurological: Negative.   Hematological: Negative.   Psychiatric/Behavioral: Negative.     Exam:   BP (!) 168/70 (BP Location: Right Arm, Patient Position: Sitting, Cuff Size: Large)   Pulse 96   Temp 98.1 F (36.7 C) (Temporal)   Resp 14   Ht 5' 1.25" (1.556 m)   Wt 170 lb 6.4 oz (77.3 kg)   LMP 06/06/2019 (Within Days)   BMI 31.93 kg/m     General appearance: alert, cooperative and appears stated age Head: normocephalic, without obvious abnormality, atraumatic Neck: no adenopathy, supple, symmetrical, trachea midline and thyroid normal to inspection and palpation Lungs: clear to auscultation bilaterally Breasts: normal appearance, no masses or tenderness, No nipple retraction or dimpling, No nipple discharge or bleeding, No axillary adenopathy Heart: regular rate and rhythm Abdomen: soft, non-tender; no masses, no organomegaly Extremities: extremities normal, atraumatic, no cyanosis or edema Skin: skin color, texture, turgor normal. No rashes or lesions Lymph nodes: cervical, supraclavicular, and axillary nodes normal. Neurologic: grossly normal  Pelvic: External genitalia:  no lesions              No abnormal inguinal nodes palpated.              Urethra:  normal appearing urethra with no masses,  tenderness or lesions              Bartholins and Skenes: normal                 Vagina: normal appearing vagina with normal color and discharge, no lesions              Cervix: no lesions              Pap taken: Yes.   Bimanual Exam:  Uterus:  normal size, contour, position, consistency, mobility, non-tender              Adnexa: no mass, fullness, tenderness            Chaperone was present for exam.  Assessment:   Well woman visit with normal exam. Hx  migraines.  HTN.   Plan: Mammogram screening discussed. Self breast awareness reviewed. Pap and HR HPV as above. Guidelines for Calcium, Vitamin D, regular exercise program including cardiovascular and weight bearing exercise. Declines Gardasil. Flu vaccine recommended.  She will follow up with her PCP regarding her elevated blood pressure.  Follow up annually and prn.   After visit summary provided.

## 2019-07-18 LAB — CYTOLOGY - PAP: Diagnosis: NEGATIVE

## 2020-01-09 ENCOUNTER — Other Ambulatory Visit: Payer: Self-pay

## 2020-01-09 ENCOUNTER — Encounter: Payer: Self-pay | Admitting: Internal Medicine

## 2020-01-09 ENCOUNTER — Ambulatory Visit (INDEPENDENT_AMBULATORY_CARE_PROVIDER_SITE_OTHER): Payer: Federal, State, Local not specified - PPO | Admitting: Internal Medicine

## 2020-01-09 VITALS — BP 148/92 | HR 97 | Temp 98.2°F | Ht 61.25 in | Wt 173.0 lb

## 2020-01-09 DIAGNOSIS — E663 Overweight: Secondary | ICD-10-CM

## 2020-01-09 DIAGNOSIS — Z0000 Encounter for general adult medical examination without abnormal findings: Secondary | ICD-10-CM

## 2020-01-09 DIAGNOSIS — E559 Vitamin D deficiency, unspecified: Secondary | ICD-10-CM | POA: Diagnosis not present

## 2020-01-09 DIAGNOSIS — R030 Elevated blood-pressure reading, without diagnosis of hypertension: Secondary | ICD-10-CM

## 2020-01-09 LAB — LIPID PANEL
Cholesterol: 137 mg/dL (ref 0–200)
HDL: 53.1 mg/dL (ref >39.00)
LDL Cholesterol: 70 mg/dL (ref 0–99)
NonHDL: 84.3
Total CHOL/HDL Ratio: 3
Triglycerides: 73 mg/dL (ref 0.0–149.0)
VLDL: 14.6 mg/dL (ref 0.0–40.0)

## 2020-01-09 LAB — URINALYSIS, ROUTINE W REFLEX MICROSCOPIC
Bilirubin Urine: NEGATIVE
Ketones, ur: NEGATIVE
Nitrite: NEGATIVE
Specific Gravity, Urine: <=1.005 — AB (ref 1.000–1.030)
Total Protein, Urine: NEGATIVE
Urine Glucose: NEGATIVE
Urobilinogen, UA: 0.2 (ref 0.0–1.0)
pH: 6 (ref 5.0–8.0)

## 2020-01-09 LAB — CBC WITH DIFFERENTIAL/PLATELET
Basophils Absolute: 0 10*3/uL (ref 0.0–0.1)
Basophils Relative: 0.8 % (ref 0.0–3.0)
Eosinophils Absolute: 0.1 10*3/uL (ref 0.0–0.7)
Eosinophils Relative: 2 % (ref 0.0–5.0)
HCT: 37.5 % (ref 36.0–46.0)
Hemoglobin: 12.2 g/dL (ref 12.0–15.0)
Lymphocytes Relative: 26.4 % (ref 12.0–46.0)
Lymphs Abs: 1.4 10*3/uL (ref 0.7–4.0)
MCHC: 32.6 g/dL (ref 30.0–36.0)
MCV: 90 fl (ref 78.0–100.0)
Monocytes Absolute: 0.5 10*3/uL (ref 0.1–1.0)
Monocytes Relative: 9.8 % (ref 3.0–12.0)
Neutro Abs: 3.2 10*3/uL (ref 1.4–7.7)
Neutrophils Relative %: 61 % (ref 43.0–77.0)
Platelets: 218 10*3/uL (ref 150.0–400.0)
RBC: 4.17 Mil/uL (ref 3.87–5.11)
RDW: 15.2 % (ref 11.5–15.5)
WBC: 5.3 10*3/uL (ref 4.0–10.5)

## 2020-01-09 LAB — HEPATIC FUNCTION PANEL
ALT: 8 U/L (ref 0–35)
AST: 17 U/L (ref 0–37)
Albumin: 4.2 g/dL (ref 3.5–5.2)
Alkaline Phosphatase: 55 U/L (ref 39–117)
Bilirubin, Direct: 0.1 mg/dL (ref 0.0–0.3)
Total Bilirubin: 0.5 mg/dL (ref 0.2–1.2)
Total Protein: 7.2 g/dL (ref 6.0–8.3)

## 2020-01-09 LAB — BASIC METABOLIC PANEL
BUN: 4 mg/dL — ABNORMAL LOW (ref 6–23)
CO2: 25 mEq/L (ref 19–32)
Calcium: 9.1 mg/dL (ref 8.4–10.5)
Chloride: 106 mEq/L (ref 96–112)
Creatinine, Ser: 0.68 mg/dL (ref 0.40–1.20)
GFR: 129.46 mL/min (ref >60.00)
Glucose, Bld: 91 mg/dL (ref 70–99)
Potassium: 3.5 mEq/L (ref 3.5–5.1)
Sodium: 137 mEq/L (ref 135–145)

## 2020-01-09 LAB — VITAMIN D 25 HYDROXY (VIT D DEFICIENCY, FRACTURES): VITD: 24.38 ng/mL — ABNORMAL LOW (ref 30.00–100.00)

## 2020-01-09 LAB — TSH: TSH: 0.99 u[IU]/mL (ref 0.35–4.50)

## 2020-01-09 MED ORDER — VITAMIN D3 50 MCG (2000 UT) PO CAPS: 2000.0000 [IU] | ORAL_CAPSULE | Freq: Every day | ORAL | 3 refills | Status: DC

## 2020-01-09 NOTE — Assessment & Plan Note (Signed)
Diet discussed Labs 

## 2020-01-09 NOTE — Assessment & Plan Note (Signed)
We discussed age appropriate health related issues, including available/recomended screening tests and vaccinations. We discussed a need for adhering to healthy diet and exercise. Labs were reviewed/ordered. All questions were answered. Age and sex related issues discussed (safe sex, seat belt use, etc.). Gardasil suggested, info given. Labs ordered. Pt declined HIV test.

## 2020-01-09 NOTE — Patient Instructions (Addendum)

## 2020-01-09 NOTE — Progress Notes (Signed)
Subjective:  Patient ID: Melissa Perkins, female    DOB: 02-15-1996  Age: 24 y.o. MRN: [id number]  CC: No chief complaint on file.   HPI Melissa Perkins presents for a well exam  Outpatient Medications Prior to Visit  Medication Sig Dispense Refill  . Nutritional Supplements (VITAMIN D BOOSTER PO) Take by mouth.     No facility-administered medications prior to visit.    ROS: Review of Systems  Constitutional: Positive for unexpected weight change. Negative for activity change, appetite change, chills and fatigue.  HENT: Negative for congestion, mouth sores and sinus pressure.   Eyes: Negative for visual disturbance.  Respiratory: Negative for cough and chest tightness.   Gastrointestinal: Negative for abdominal pain and nausea.  Genitourinary: Negative for difficulty urinating, frequency and vaginal pain.  Musculoskeletal: Negative for back pain and gait problem.  Skin: Negative for pallor and rash.  Neurological: Negative for dizziness, tremors, weakness, numbness and headaches.  Psychiatric/Behavioral: Negative for confusion and sleep disturbance.    Objective:  BP (!) 148/92 (BP Location: Left Arm, Patient Position: Sitting, Cuff Size: Normal)   Pulse 97   Temp 98.2 F (36.8 C) (Oral)   Ht 5' 1.25" (1.556 m)   Wt 173 lb (78.5 kg)   SpO2 98%   BMI 32.42 kg/m   BP Readings from Last 3 Encounters:  01/09/20 (!) 148/92  07/14/19 (!) 168/70  09/18/18 136/87    Wt Readings from Last 3 Encounters:  01/09/20 173 lb (78.5 kg)  07/14/19 170 lb 6.4 oz (77.3 kg)  05/02/18 144 lb (65.3 kg)    Physical Exam Constitutional:      General: She is not in acute distress.    Appearance: She is well-developed. She is obese.  HENT:     Head: Normocephalic.     Right Ear: External ear normal.     Left Ear: External ear normal.     Nose: Nose normal.  Eyes:     General:        Right eye: No discharge.        Left eye: No discharge.     Conjunctiva/sclera: Conjunctivae  normal.     Pupils: Pupils are equal, round, and reactive to light.  Neck:     Thyroid: No thyromegaly.     Vascular: No JVD.     Trachea: No tracheal deviation.  Cardiovascular:     Rate and Rhythm: Normal rate and regular rhythm.     Heart sounds: Normal heart sounds.  Pulmonary:     Effort: No respiratory distress.     Breath sounds: No stridor. No wheezing.  Abdominal:     General: Bowel sounds are normal. There is no distension.     Palpations: Abdomen is soft. There is no mass.     Tenderness: There is no abdominal tenderness. There is no guarding or rebound.  Musculoskeletal:        General: No tenderness.     Cervical back: Normal range of motion and neck supple.  Lymphadenopathy:     Cervical: No cervical adenopathy.  Skin:    Findings: No erythema or rash.  Neurological:     Cranial Nerves: No cranial nerve deficit.     Motor: No abnormal muscle tone.     Coordination: Coordination normal.     Deep Tendon Reflexes: Reflexes normal.  Psychiatric:        Behavior: Behavior normal.        Thought Content: Thought content normal.  Judgment: Judgment normal.     Lab Results  Component Value Date   WBC 8.5 05/02/2018   HGB 12.2 05/02/2018   HCT 37.0 05/02/2018   PLT 256.0 05/02/2018   GLUCOSE 87 05/02/2018   CHOL 144 05/02/2018   TRIG 52.0 05/02/2018   HDL 62.00 05/02/2018   LDLCALC 71 05/02/2018   ALT 8 05/02/2018   AST 14 05/02/2018   NA 137 05/02/2018   K 3.8 05/02/2018   CL 103 05/02/2018   CREATININE 0.72 05/02/2018   BUN 6 05/02/2018   CO2 26 05/02/2018   TSH 0.63 05/02/2018   HGBA1C 5.2 09/20/2017    No results found.  Assessment & Plan:    Walker Kehr, MD

## 2020-01-09 NOTE — Assessment & Plan Note (Signed)
Check BP at home NAS diet 

## 2020-01-09 NOTE — Assessment & Plan Note (Addendum)
Vit D 

## 2020-01-10 ENCOUNTER — Other Ambulatory Visit: Payer: Self-pay | Admitting: Internal Medicine

## 2020-01-10 MED ORDER — VITAMIN D3 1.25 MG (50000 UT) PO CAPS: 1.0000 | ORAL_CAPSULE | ORAL | 0 refills | Status: DC

## 2020-03-27 DIAGNOSIS — H40013 Open angle with borderline findings, low risk, bilateral: Secondary | ICD-10-CM | POA: Diagnosis not present

## 2020-03-27 DIAGNOSIS — H5213 Myopia, bilateral: Secondary | ICD-10-CM | POA: Diagnosis not present

## 2020-03-27 DIAGNOSIS — H04123 Dry eye syndrome of bilateral lacrimal glands: Secondary | ICD-10-CM | POA: Diagnosis not present

## 2020-11-02 DIAGNOSIS — U071 COVID-19: Secondary | ICD-10-CM | POA: Diagnosis not present

## 2020-11-05 ENCOUNTER — Telehealth (INDEPENDENT_AMBULATORY_CARE_PROVIDER_SITE_OTHER): Payer: Federal, State, Local not specified - PPO | Admitting: Family Medicine

## 2020-11-05 DIAGNOSIS — U071 COVID-19: Secondary | ICD-10-CM

## 2020-11-05 NOTE — Patient Instructions (Signed)
   ---------------------------------------------------------------------------------------------------------------------------      WORK SLIP:  Patient Melissa Perkins,  April 12, 1996, was seen for a medical visit today, 11/05/20 . Please excuse from work for a COVID like illness. We advise 10 days minimum from the onset of symptoms (11/01/2020) PLUS 1 day of no fever and improved symptoms. Will defer to employer for a sooner return to work if symptoms have resolved, it is greater than 5 days since the positive test and the patient can wear a high-quality, tight fitting mask such as N95 or KN95 at all times for an additional 5 days. Would also suggest COVID19 antigen testing is negative prior to return.  Sincerely: E-signature: Dr. Kriste Basque, DO [facility] [place] Ph: [phone]   ------------------------------------------------------------------------------------------------------------------------------    HOME CARE TIPS:   -can use tylenol or aleve if needed for fevers, aches and pains per instructions  -can use nasal saline a few times per day if nasal congestion, sometime a short course of Afrin nasal spray for 3 days can help as well  -stay hydrated, drink plenty of fluids and eat small healthy meals - avoid dairy  -If the Covid test is positive, check out the [facility] website for more information on home care, transmission and treatment for COVID19  -follow up with your doctor in 2-3 days unless improving and feeling better  -stay home while sick, except to seek medical care, and if you have COVID19 please stay home for a full 10 days since the onset of symptoms PLUS one day of no fever and feeling better.  It was nice to meet you today, and I really hope you are feeling better soon. I help [place] out with telemedicine visits on Tuesdays and Thursdays and am available for visits on those days. If you have any concerns or questions following this visit please  schedule a follow up visit with your Primary Care doctor or seek care at a local urgent care clinic to avoid delays in care.    Seek in person care promptly if your symptoms worsen, new concerns arise or you are not improving with treatment. Call 911 and/or seek emergency care if you symptoms are severe or life threatening.

## 2020-11-05 NOTE — Progress Notes (Signed)
Virtual Visit via Telephone Note  I connected with Melissa Perkins on 11/05/20 at 12:00 PM EST by telephone and verified that I am speaking with the correct person using two identifiers.   I discussed the limitations, risks, security and privacy concerns of performing an evaluation and management service by telephone and the availability of in person appointments. I also discussed with the patient that there may be a patient responsible charge related to this service. The patient expressed understanding and agreed to proceed.  Location patient: home, [place] Location provider: work or home office Participants present for the call: patient, provider Patient did not have a visit with me in the prior 7 days to address this/these issue(s).   History of Present Illness:  Acute telemedicine visit for COVID19 illness: -Onset: tested positive for covid 11/01/2020, then started having symptoms on 11/02/20 -had close exposure to someone with covid earlier that week -Symptoms include:  sore throat, feeling more tired, sneezing, runny nose, mild cough -feels like is now starting to improve -Denies:fevers, SOB, CP, vomiting, diarrhea, loss of taste or smell, inability to get our of bed/eat/drink -Has tried:vit D, dayquil -Pertinent past medical history: denies any -Pertinent medication allergies: amoxicillin -COVID-19 vaccine status: fully vaccinated   Observations/Objective: Patient sounds cheerful and well on the phone. I do not appreciate any SOB. Speech and thought processing are grossly intact. Patient reported vitals:  Assessment and Plan:  COVID-19  -we discussed possible serious and likely etiologies, options for evaluation and workup, limitations of telemedicine visit vs in person visit, treatment, treatment risks and precautions. Pt prefers to treat via telemedicine empirically rather than in person at this moment.  Seems to be having mild symptoms with COVID-19.  Reports is starting to feel  better.  Opted for symptomatic care with nasal saline, analgesics if needed, vitamin C&D.  She did not feel that she n should eeded something for cough.  Discussed treatment, potential complications, isolation and precautions.  She also had questions about the COVID booster and type.  Discussed and answered her questions. Work/School slipped offered: provided in patient instructions Scheduled follow up with PCP offered: Agrees to follow-up as needed Advised to seek prompt in person care if worsening, new symptoms arise, or if is not improving with treatment. Advised of options for inperson care in case PCP office not available. Did let the patient know that I only do telemedicine shifts for [place] on Tuesdays and Thursdays and advised a follow up visit with PCP or at an [facility] if has further questions or concerns.   Follow Up Instructions:  I did not refer this patient for an OV with me in the next 24 hours for this/these issue(s).  I discussed the assessment and treatment plan with the patient. The patient was provided an opportunity to ask questions and all were answered. The patient agreed with the plan and demonstrated an understanding of the instructions.   I spent 14 minutes on the date of this visit in the care of this patient. See summary of tasks completed to properly care for this patient in the detailed notes above which included review of PMH, medications, allergies, evaluation of the patient and ordering and instructing patient on testing and care options.     Terressa Koyanagi, DO

## 2020-11-08 ENCOUNTER — Telehealth: Payer: Self-pay | Admitting: Family Medicine

## 2020-11-08 NOTE — Telephone Encounter (Signed)
Pt is calling in stating that she is waiting on a letter to let her know when she should be returning back to work.

## 2020-11-10 NOTE — Telephone Encounter (Signed)
Melissa Perkins, Please write her a letter. Thanks, AP

## 2021-01-19 DIAGNOSIS — H9202 Otalgia, left ear: Secondary | ICD-10-CM | POA: Diagnosis not present

## 2021-01-19 DIAGNOSIS — K047 Periapical abscess without sinus: Secondary | ICD-10-CM | POA: Diagnosis not present

## 2021-01-20 ENCOUNTER — Telehealth: Payer: Self-pay | Admitting: Internal Medicine

## 2021-01-20 NOTE — Telephone Encounter (Signed)
[facility] Report/Call: 01/19/2021 ---Caller states that she is having earache, headaches. Caller advised that sx started 3 weeks ago. No headache at this time. Earache. Unknown temp. No other sx.  Advised see PCP within 24 hours.  Spoke with patient, She went to UC yesterday and no longer needs to be seen for this issues. She did set up a CPE for April.

## 2021-01-24 ENCOUNTER — Telehealth: Payer: Self-pay | Admitting: Internal Medicine

## 2021-01-24 DIAGNOSIS — M5410 Radiculopathy, site unspecified: Secondary | ICD-10-CM | POA: Diagnosis not present

## 2021-01-24 DIAGNOSIS — R200 Anesthesia of skin: Secondary | ICD-10-CM | POA: Diagnosis not present

## 2021-01-24 NOTE — Telephone Encounter (Signed)
[facility] FYI   Caller states she is having numbness in her face, arm and hand. Has tooth infection on that side and was having face numbness from that but now it includes arm and hand.  Advised to go to urgent care/walk-in clinic within 4 hours. Patient understood and agreed.

## 2021-01-24 NOTE — Telephone Encounter (Signed)
Patient is having numbness in left side of mouth and arm since yesterday. Transferred to [facility].

## 2021-01-30 ENCOUNTER — Encounter: Payer: Federal, State, Local not specified - PPO | Admitting: Internal Medicine

## 2021-04-24 ENCOUNTER — Other Ambulatory Visit: Payer: Self-pay

## 2021-04-24 ENCOUNTER — Ambulatory Visit (INDEPENDENT_AMBULATORY_CARE_PROVIDER_SITE_OTHER): Payer: Federal, State, Local not specified - PPO | Admitting: Internal Medicine

## 2021-04-24 ENCOUNTER — Ambulatory Visit (INDEPENDENT_AMBULATORY_CARE_PROVIDER_SITE_OTHER): Payer: Federal, State, Local not specified - PPO

## 2021-04-24 ENCOUNTER — Encounter: Payer: Self-pay | Admitting: Internal Medicine

## 2021-04-24 ENCOUNTER — Telehealth: Payer: Self-pay | Admitting: Internal Medicine

## 2021-04-24 VITALS — BP 114/68 | HR 97 | Temp 98.4°F | Resp 16 | Ht 61.25 in | Wt 162.0 lb

## 2021-04-24 DIAGNOSIS — R10816 Epigastric abdominal tenderness: Secondary | ICD-10-CM

## 2021-04-24 DIAGNOSIS — R109 Unspecified abdominal pain: Secondary | ICD-10-CM | POA: Diagnosis not present

## 2021-04-24 DIAGNOSIS — K529 Noninfective gastroenteritis and colitis, unspecified: Secondary | ICD-10-CM | POA: Diagnosis not present

## 2021-04-24 DIAGNOSIS — R1013 Epigastric pain: Secondary | ICD-10-CM | POA: Diagnosis not present

## 2021-04-24 LAB — URINALYSIS, ROUTINE W REFLEX MICROSCOPIC
Bilirubin Urine: NEGATIVE
Hgb urine dipstick: NEGATIVE
Ketones, ur: NEGATIVE
Leukocytes,Ua: NEGATIVE
Nitrite: NEGATIVE
RBC / HPF: NONE SEEN (ref 0–?)
Specific Gravity, Urine: <=1.005 — AB (ref 1.000–1.030)
Total Protein, Urine: NEGATIVE
Urine Glucose: NEGATIVE
Urobilinogen, UA: 0.2 (ref 0.0–1.0)
pH: 6 (ref 5.0–8.0)

## 2021-04-24 LAB — BASIC METABOLIC PANEL
BUN: 6 mg/dL (ref 6–23)
CO2: 27 mEq/L (ref 19–32)
Calcium: 9.8 mg/dL (ref 8.4–10.5)
Chloride: 101 mEq/L (ref 96–112)
Creatinine, Ser: 0.79 mg/dL (ref 0.40–1.20)
GFR: 104.61 mL/min (ref >60.00)
Glucose, Bld: 87 mg/dL (ref 70–99)
Potassium: 3.8 mEq/L (ref 3.5–5.1)
Sodium: 137 mEq/L (ref 135–145)

## 2021-04-24 LAB — CBC WITH DIFFERENTIAL/PLATELET
Basophils Absolute: 0 10*3/uL (ref 0.0–0.1)
Basophils Relative: 0.6 % (ref 0.0–3.0)
Eosinophils Absolute: 0 10*3/uL (ref 0.0–0.7)
Eosinophils Relative: 0.6 % (ref 0.0–5.0)
HCT: 39.2 % (ref 36.0–46.0)
Hemoglobin: 13.1 g/dL (ref 12.0–15.0)
Lymphocytes Relative: 21.6 % (ref 12.0–46.0)
Lymphs Abs: 1.5 10*3/uL (ref 0.7–4.0)
MCHC: 33.5 g/dL (ref 30.0–36.0)
MCV: 88.9 fl (ref 78.0–100.0)
Monocytes Absolute: 0.6 10*3/uL (ref 0.1–1.0)
Monocytes Relative: 8.2 % (ref 3.0–12.0)
Neutro Abs: 4.8 10*3/uL (ref 1.4–7.7)
Neutrophils Relative %: 69 % (ref 43.0–77.0)
Platelets: 235 10*3/uL (ref 150.0–400.0)
RBC: 4.4 Mil/uL (ref 3.87–5.11)
RDW: 14 % (ref 11.5–15.5)
WBC: 7 10*3/uL (ref 4.0–10.5)

## 2021-04-24 LAB — LIPASE: Lipase: 23 U/L (ref 11.0–59.0)

## 2021-04-24 LAB — HEPATIC FUNCTION PANEL
ALT: 12 U/L (ref 0–35)
AST: 20 U/L (ref 0–37)
Albumin: 4.8 g/dL (ref 3.5–5.2)
Alkaline Phosphatase: 64 U/L (ref 39–117)
Bilirubin, Direct: 0.1 mg/dL (ref 0.0–0.3)
Total Bilirubin: 0.8 mg/dL (ref 0.2–1.2)
Total Protein: 8.1 g/dL (ref 6.0–8.3)

## 2021-04-24 LAB — AMYLASE: Amylase: 85 U/L (ref 27–131)

## 2021-04-24 LAB — HCG, QUANTITATIVE, PREGNANCY: Quantitative HCG: <0.6 m[IU]/mL

## 2021-04-24 MED ORDER — PROMETHAZINE HCL 12.5 MG PO TABS: 12.5000 mg | ORAL_TABLET | Freq: Four times a day (QID) | ORAL | 0 refills | Status: DC | PRN

## 2021-04-24 NOTE — Patient Instructions (Signed)

## 2021-04-24 NOTE — Progress Notes (Addendum)
Subjective:  Patient ID: Melissa Perkins, female    DOB: [birth year]  Age: 25 y.o. MRN: [id number]  CC: Abdominal Pain  This visit occurred during the SARS-CoV-2 public health emergency.  Safety protocols were in place, including screening questions prior to the visit, additional usage of staff PPE, and extensive cleaning of exam room while observing appropriate contact time as indicated for disinfecting solutions.    HPI Melissa Perkins presents for the complaint of a 2 day hx of epigastric abd pain that she describes as a burning sensation.  She has had nausea but no vomiting and a good appetite.  She tried Tums and did not get any symptom relief.  She had 4 loose bowel movements yesterday and today she had 2.  She denies cramping, fever, chills, dysuria, or hematuria.  She denies bright red blood per rectum or melena.  Outpatient Medications Prior to Visit  Medication Sig Dispense Refill   Cholecalciferol (VITAMIN D3) 50 MCG (2000 UT) capsule Take 1 capsule (2,000 Units total) by mouth daily. 100 capsule 3   Nutritional Supplements (VITAMIN D BOOSTER PO) Take by mouth.     Cholecalciferol (VITAMIN D3) 1.25 MG (50000 UT) CAPS Take 1 capsule by mouth once a week. (Patient not taking: Reported on 04/24/2021) 6 capsule 0   No facility-administered medications prior to visit.    ROS Review of Systems  Constitutional:  Negative for appetite change, diaphoresis, fatigue and unexpected weight change.  HENT: Negative.    Eyes: Negative.   Respiratory:  Negative for cough, chest tightness, shortness of breath and wheezing.   Gastrointestinal:  Positive for abdominal pain, diarrhea and nausea. Negative for abdominal distention, blood in stool, constipation, rectal pain and vomiting.  Endocrine: Negative.   Genitourinary:  Negative for difficulty urinating and dysuria.  Musculoskeletal:  Negative for arthralgias and myalgias.  Skin:  Negative for rash.  Neurological: Negative.  Negative for  dizziness and light-headedness.  Psychiatric/Behavioral: Negative.     Objective:  BP 114/68 (BP Location: Left Arm, Patient Position: Sitting, Cuff Size: Large)   Pulse 97   Temp 98.4 F (36.9 C) (Oral)   Resp 16   Ht 5' 1.25" (1.556 m)   Wt 162 lb (73.5 kg)   LMP 03/31/2021   SpO2 99%   BMI 30.36 kg/m   BP Readings from Last 3 Encounters:  04/24/21 114/68  01/09/20 (!) 148/92  07/14/19 (!) 168/70    Wt Readings from Last 3 Encounters:  04/24/21 162 lb (73.5 kg)  01/09/20 173 lb (78.5 kg)  07/14/19 170 lb 6.4 oz (77.3 kg)    Physical Exam Vitals reviewed.  Constitutional:      General: She is not in acute distress.    Appearance: Normal appearance. She is well-developed. She is not ill-appearing, toxic-appearing or diaphoretic.  HENT:     Nose: Nose normal.     Mouth/Throat:     Mouth: Mucous membranes are moist.  Eyes:     General: No scleral icterus.    Conjunctiva/sclera: Conjunctivae normal.  Cardiovascular:     Rate and Rhythm: Normal rate and regular rhythm.     Heart sounds: No murmur heard. Pulmonary:     Effort: Pulmonary effort is normal.     Breath sounds: No stridor. No wheezing, rhonchi or rales.  Abdominal:     General: Abdomen is flat. Bowel sounds are increased. There is no distension.     Palpations: There is no hepatomegaly, splenomegaly or mass.  Tenderness: There is abdominal tenderness in the epigastric area. There is no guarding or rebound. Negative signs include Murphy's sign.     Hernia: No hernia is present.  Musculoskeletal:        General: Normal range of motion.     Cervical back: Neck supple.  Lymphadenopathy:     Cervical: No cervical adenopathy.  Neurological:     General: No focal deficit present.     Mental Status: She is alert.  Psychiatric:        Mood and Affect: Mood normal.        Behavior: Behavior normal.    Lab Results  Component Value Date   WBC 7.0 04/24/2021   HGB 13.1 04/24/2021   HCT 39.2 04/24/2021    PLT 235.0 04/24/2021   GLUCOSE 87 04/24/2021   CHOL 137 01/09/2020   TRIG 73.0 01/09/2020   HDL 53.10 01/09/2020   LDLCALC 70 01/09/2020   ALT 12 04/24/2021   AST 20 04/24/2021   NA 137 04/24/2021   K 3.8 04/24/2021   CL 101 04/24/2021   CREATININE 0.79 04/24/2021   BUN 6 04/24/2021   CO2 27 04/24/2021   TSH 0.99 01/09/2020   HGBA1C 5.2 09/20/2017    DG ABD ACUTE 2+V W 1V CHEST  Result Date: 04/24/2021 CLINICAL DATA:  Epigastric pain. EXAM: DG ABDOMEN ACUTE WITH 1 VIEW CHEST COMPARISON:  No prior. FINDINGS: No acute cardiopulmonary disease. Soft tissues the abdomen are unremarkable. No bowel distention. No free air. Mild thoracolumbar spine scoliosis. No acute bony abnormality. IMPRESSION: No acute abnormality. Electronically Signed   By: Maisie Fus  Register   On: 04/24/2021 15:58     Assessment & Plan:   Melissa Perkins was seen today for abdominal pain.  Diagnoses and all orders for this visit:  Epigastric abdominal tenderness without rebound tenderness- She has symptoms consistent with gastroenteritis.  Her x-ray and labs are reassuring.  She has mild tenderness to palpation in the epigastric region but no signs of an acute abdominal process.  Will treat symptomatically. -     CBC with Differential/Platelet; Future -     Basic metabolic panel; Future -     Amylase; Future -     Lipase; Future -     hCG, quantitative, pregnancy; Future -     Urinalysis, Routine w reflex microscopic; Future -     Hepatic function panel; Future -     DG ABD ACUTE 2+V W 1V CHEST; Future -     Hepatic function panel -     Urinalysis, Routine w reflex microscopic -     hCG, quantitative, pregnancy -     Lipase -     Amylase -     Basic metabolic panel -     CBC with Differential/Platelet  Gastroenteritis, acute -     promethazine (PHENERGAN) 12.5 MG tablet; Take 1 tablet (12.5 mg total) by mouth every 6 (six) hours as needed for nausea or vomiting.  I am having Melissa Perkins start on  promethazine. I am also having her maintain her Nutritional Supplements (VITAMIN D BOOSTER PO), Vitamin D3, and Vitamin D3.  Meds ordered this encounter  Medications   promethazine (PHENERGAN) 12.5 MG tablet    Sig: Take 1 tablet (12.5 mg total) by mouth every 6 (six) hours as needed for nausea or vomiting.    Dispense:  30 tablet    Refill:  0      Follow-up: Return if symptoms worsen or fail to  improve.  Scarlette Calico, MD

## 2021-04-24 NOTE — Telephone Encounter (Signed)
[facility] FYI:   --Caller states she has diarrhea since last night. She has discomfort at the top of her abdomen but no fever  Advised to be seen in 4 hours; pt has an appt with Dr.Jones 6.30.22

## 2021-04-25 NOTE — Telephone Encounter (Signed)
Noted! Thank you

## 2021-07-19 ENCOUNTER — Other Ambulatory Visit: Payer: Self-pay

## 2021-07-19 ENCOUNTER — Emergency Department
Admission: EM | Admit: 2021-07-19 | Discharge: 2021-07-19 | Disposition: A | Discharge status: Home / Self Care | Payer: Managed Care, Other (non HMO) | Attending: Emergency Medicine | Admitting: Emergency Medicine

## 2021-07-19 DIAGNOSIS — Z3A00 Weeks of gestation of pregnancy not specified: Secondary | ICD-10-CM | POA: Insufficient documentation

## 2021-07-19 DIAGNOSIS — Z3201 Encounter for pregnancy test, result positive: Secondary | ICD-10-CM

## 2021-07-19 DIAGNOSIS — O219 Vomiting of pregnancy, unspecified: Secondary | ICD-10-CM | POA: Diagnosis not present

## 2021-07-19 LAB — POC URINE PREG, ED: Preg Test, Ur: POSITIVE — AB

## 2021-07-19 MED ORDER — PNV PRENATAL PLUS MULTIVITAMIN 27-1 MG PO TABS: 1.0000 | ORAL_TABLET | Freq: Every day | ORAL | 0 refills | Status: AC

## 2021-07-19 MED ORDER — METOCLOPRAMIDE HCL 10 MG PO TABS: 10.0000 mg | ORAL_TABLET | Freq: Three times a day (TID) | ORAL | 0 refills | Status: DC | PRN

## 2021-07-19 NOTE — ED Notes (Signed)
C/o nausea and vomiting, possibly pregnant. LMP 06/09/2021, G1. No other symptoms reported at this time and most recent period was normal per pt.

## 2021-07-19 NOTE — ED Provider Notes (Signed)
[facility] Emergency Department Provider Note  ____________________________________________  Time seen: Approximately 5:31 PM  I have reviewed the triage vital signs and the nursing notes.   HISTORY  Chief Complaint Possible Pregnancy and Emesis    HPI Melissa Perkins is a 25 y.o. female who presents the emergency department with a possible pregnancy.  Patient states that she missed her last menstrual cycle which should have been roughly 10 days ago.  Patient's previous menstrual cycle was normal.  Patient states that she has been nauseated several mornings, had an episode of emesis earlier today.  She denies any abdominal pain, no nausea currently.  No urinary symptoms.  No vaginal bleeding or discharge.  Patient is G0, P0.  Patient denies any other complaints at this time.  She is here for confirmation of pregnancy as she has taken 2 positive pregnancy tests at home.  She is already scheduled to see OB/GYN in 3 days.       Past Medical History:  Diagnosis Date   Malaria    Migraines    without aura    Patient Active Problem List   Diagnosis Date Noted   Epigastric abdominal tenderness without rebound tenderness 04/24/2021   Gastroenteritis, acute 04/24/2021   Vitamin D deficiency 01/09/2020   Migraine 06/14/2019   Fever 06/14/2019   Well adult exam 05/02/2018   Body mass index, pediatric, 85th percentile to less than 95th percentile for age 58/31/2017   Overweight 07/31/2014   Elevated BP without diagnosis of hypertension 01/18/2012    Past Surgical History:  Procedure Laterality Date   WISDOM TOOTH EXTRACTION      Prior to Admission medications   Medication Sig Start Date End Date Taking? Authorizing Provider  metoCLOPramide (REGLAN) 10 MG tablet Take 1 tablet (10 mg total) by mouth every 8 (eight) hours as needed for nausea or vomiting. 07/19/21 07/19/22 Yes Jeet Shough, Delorise Royals, PA-C  Prenatal Vit-Fe Fumarate-FA (PNV PRENATAL PLUS  MULTIVITAMIN) 27-1 MG TABS Take 1 tablet by mouth daily. 07/19/21  Yes Jovahn Breit, Delorise Royals, PA-C  Cholecalciferol (VITAMIN D3) 1.25 MG (50000 UT) CAPS Take 1 capsule by mouth once a week. Patient not taking: Reported on 04/24/2021 01/10/20   Plotnikov, Georgina Quint, MD  Cholecalciferol (VITAMIN D3) 50 MCG (2000 UT) capsule Take 1 capsule (2,000 Units total) by mouth daily. 01/09/20   Plotnikov, Georgina Quint, MD  Nutritional Supplements (VITAMIN D BOOSTER PO) Take by mouth.    [provider]  promethazine (PHENERGAN) 12.5 MG tablet Take 1 tablet (12.5 mg total) by mouth every 6 (six) hours as needed for nausea or vomiting. 04/24/21   Etta Grandchild, MD    Allergies Amoxicillin  Family History  Problem Relation Age of Onset   Hypertension Father    Breast cancer Maternal Grandmother    Prostate cancer Maternal Grandfather    Colon cancer Maternal Grandfather    Heart attack Paternal Grandmother    Obesity Paternal Grandmother    Stroke Paternal Grandfather    Vascular Disease Paternal Grandfather     Social History Social History   Tobacco Use   Smoking status: Never   Smokeless tobacco: Never  Vaping Use   Vaping Use: Never used  Substance Use Topics   Alcohol use: Yes    Comment: occ   Drug use: Never     Review of Systems  Constitutional: No fever/chills Eyes: No visual changes. No discharge ENT: No upper respiratory complaints. Cardiovascular: no chest pain. Respiratory: no cough. No  SOB. Gastrointestinal: No abdominal pain.  Morning nausea with 1 episode of emesis today.  No diarrhea.  No constipation. Genitourinary: Negative for dysuria. No hematuria.  Amenorrhea with positive pregnancy test at home Musculoskeletal: Negative for musculoskeletal pain. Skin: Negative for rash, abrasions, lacerations, ecchymosis. Neurological: Negative for headaches, focal weakness or numbness.  10 System ROS otherwise  negative.  ____________________________________________   PHYSICAL EXAM:  VITAL SIGNS: ED Triage Vitals [07/19/21 1350]  Enc Vitals Group     BP 135/80     Pulse Rate 84     Resp 16     Temp 98.6 F (37 C)     Temp src      SpO2 100 %     Weight      Height 5\' 1"  (1.549 m)     Head Circumference      Peak Flow      Pain Score 0     Pain Loc      Pain Edu?      Excl. in GC?      Constitutional: Alert and oriented. Well appearing and in no acute distress. Eyes: Conjunctivae are normal. PERRL. EOMI. Head: Atraumatic. ENT:      Ears:       Nose: No congestion/rhinnorhea.      Mouth/Throat: Mucous membranes are moist.  Neck: No stridor.    Cardiovascular: Normal rate, regular rhythm. Normal S1 and S2.  Good peripheral circulation. Respiratory: Normal respiratory effort without tachypnea or retractions. Lungs CTAB. Good air entry to the bases with no decreased or absent breath sounds. Gastrointestinal: Bowel sounds 4 quadrants. Soft and nontender to palpation. No guarding or rigidity. No palpable masses. No distention. No CVA tenderness. Musculoskeletal: Full range of motion to all extremities. No gross deformities appreciated. Neurologic:  Normal speech and language. No gross focal neurologic deficits are appreciated.  Skin:  Skin is warm, dry and intact. No rash noted. Psychiatric: Mood and affect are normal. Speech and behavior are normal. Patient exhibits appropriate insight and judgement.   ____________________________________________   LABS (all labs ordered are listed, but only abnormal results are displayed)  Labs Reviewed  POC URINE PREG, ED - Abnormal; Notable for the following components:      Result Value   Preg Test, Ur Positive (*)    All other components within normal limits   ____________________________________________  EKG   ____________________________________________  RADIOLOGY   No results  found.  ____________________________________________    PROCEDURES  Procedure(s) performed:    Procedures    Medications - No data to display   ____________________________________________   INITIAL IMPRESSION / ASSESSMENT AND PLAN / ED COURSE  Pertinent labs & imaging results that were available during my care of the patient were reviewed by me and considered in my medical decision making (see chart for details).  Review of the [place] CSRS was performed in accordance of the [facility] prior to dispensing any controlled drugs.           Patient's diagnosis is consistent with positive pregnancy.  Patient presents the emergency department with concern for possible pregnancy.  Patient missed her last menstrual cycle roughly 10 days ago, has had some morning nausea and emesis today.  No pain complaints.  No vaginal bleeding or discharge.  Positive pregnancy test here.  She will follow-up with her OB/GYN which she is already scheduled to see in 3 days..  Patient will be prescribed prenatal vitamins and an antiemetic.  Discussed medications that the patient can and  cannot take.  As well as counseling on the use of alcohol, tobacco, caffeine.  Again patient already has an appointment with her OB/GYN in 3 days.  Return precautions discussed with the patient.. Patient is given ED precautions to return to the ED for any worsening or new symptoms.     ____________________________________________  FINAL CLINICAL IMPRESSION(S) / ED DIAGNOSES  Final diagnoses:  Positive pregnancy test      NEW MEDICATIONS STARTED DURING THIS VISIT:  ED Discharge Orders          Ordered    Prenatal Vit-Fe Fumarate-FA (PNV PRENATAL PLUS MULTIVITAMIN) 27-1 MG TABS  Daily        07/19/21 1741    metoCLOPramide (REGLAN) 10 MG tablet  Every 8 hours PRN        07/19/21 1745                This chart was dictated using voice recognition software/Dragon. Despite best efforts to proofread, errors can  occur which can change the meaning. Any change was purely unintentional.    Racheal Patches, PA-C 07/19/21 1745    Sharman Cheek, MD 07/20/21 [phone]

## 2021-07-19 NOTE — ED Triage Notes (Signed)
Pt c/o headache and emesis starting this morning.  Currently, denies pain.   Pt reports she could possibly be pregnant.  Sts she has had 2 positive tests at home.

## 2021-07-23 ENCOUNTER — Other Ambulatory Visit: Payer: Self-pay

## 2021-07-23 ENCOUNTER — Encounter: Payer: Self-pay | Admitting: Nurse Practitioner

## 2021-07-23 ENCOUNTER — Ambulatory Visit: Payer: Federal, State, Local not specified - PPO | Admitting: Nurse Practitioner

## 2021-07-23 VITALS — BP 118/76

## 2021-07-23 DIAGNOSIS — Z3201 Encounter for pregnancy test, result positive: Secondary | ICD-10-CM

## 2021-07-23 DIAGNOSIS — Z3A01 Less than 8 weeks gestation of pregnancy: Secondary | ICD-10-CM

## 2021-07-23 NOTE — Progress Notes (Signed)
   Acute Office Visit  Subjective:    Patient ID: Melissa Perkins, female    DOB: [birth year], 25 y.o.   MRN: [id number]   HPI 25 y.o. presents today for positive pregnancy test. Confirmed at [facility] 07/19/2021. LMP 06/09/2021. Happy with pregnancy and good support system. Taking PNV. No bleeding or abdominal pain.    Review of Systems  Constitutional:  Positive for fatigue.  Gastrointestinal:  Positive for nausea. Negative for abdominal pain and vomiting.  Genitourinary:  Negative for vaginal bleeding.      Objective:    Physical Exam Constitutional:      Appearance: Normal appearance.  Abdominal:     Palpations: Abdomen is soft.     Tenderness: There is no abdominal tenderness.  GU: Not indicated  BP 118/76   LMP 06/09/2021  Wt Readings from Last 3 Encounters:  04/24/21 162 lb (73.5 kg)  01/09/20 173 lb (78.5 kg)  07/14/19 170 lb 6.4 oz (77.3 kg)        Assessment & Plan:   Problem List Items Addressed This Visit   None Visit Diagnoses     Less than [redacted] weeks gestation of pregnancy    -  Primary      Plan: Congratulated on pregnancy. Discussed pregnancy safe behaviors and red flag signs. Continue prenatal vitamin. Establish with OB, plans to use mother's OB office. All questions answered.     Olivia Mackie DNP, 11:52 AM 07/23/2021

## 2021-08-13 ENCOUNTER — Encounter: Payer: Federal, State, Local not specified - PPO | Admitting: Internal Medicine

## 2021-09-14 ENCOUNTER — Encounter (HOSPITAL_COMMUNITY): Payer: Self-pay | Admitting: Emergency Medicine

## 2021-09-14 ENCOUNTER — Other Ambulatory Visit: Payer: Self-pay

## 2021-09-14 ENCOUNTER — Inpatient Hospital Stay (HOSPITAL_COMMUNITY)
Admission: AD | Admit: 2021-09-14 | Discharge: 2021-09-14 | Disposition: A | Discharge status: Home / Self Care | Payer: Federal, State, Local not specified - PPO | Attending: Obstetrics & Gynecology | Admitting: Obstetrics & Gynecology

## 2021-09-14 ENCOUNTER — Inpatient Hospital Stay (HOSPITAL_COMMUNITY): Payer: Federal, State, Local not specified - PPO

## 2021-09-14 DIAGNOSIS — Z3A14 14 weeks gestation of pregnancy: Secondary | ICD-10-CM | POA: Diagnosis not present

## 2021-09-14 DIAGNOSIS — O4690 Antepartum hemorrhage, unspecified, unspecified trimester: Secondary | ICD-10-CM

## 2021-09-14 DIAGNOSIS — Z6790 Unspecified blood type, Rh positive: Secondary | ICD-10-CM | POA: Diagnosis not present

## 2021-09-14 DIAGNOSIS — O26891 Other specified pregnancy related conditions, first trimester: Secondary | ICD-10-CM | POA: Insufficient documentation

## 2021-09-14 DIAGNOSIS — O26851 Spotting complicating pregnancy, first trimester: Secondary | ICD-10-CM | POA: Diagnosis not present

## 2021-09-14 DIAGNOSIS — O209 Hemorrhage in early pregnancy, unspecified: Secondary | ICD-10-CM | POA: Diagnosis not present

## 2021-09-14 DIAGNOSIS — Z3A13 13 weeks gestation of pregnancy: Secondary | ICD-10-CM | POA: Diagnosis not present

## 2021-09-14 DIAGNOSIS — O468X1 Other antepartum hemorrhage, first trimester: Secondary | ICD-10-CM

## 2021-09-14 DIAGNOSIS — R030 Elevated blood-pressure reading, without diagnosis of hypertension: Secondary | ICD-10-CM | POA: Insufficient documentation

## 2021-09-14 LAB — WET PREP, GENITAL
Clue Cells Wet Prep HPF POC: NONE SEEN
Sperm: NONE SEEN
Trich, Wet Prep: NONE SEEN
WBC, Wet Prep HPF POC: >=10 — AB (ref <10)
Yeast Wet Prep HPF POC: NONE SEEN

## 2021-09-14 LAB — POC URINE PREG, ED: Preg Test, Ur: POSITIVE — AB

## 2021-09-14 LAB — ABO/RH: ABO/RH(D): O POS

## 2021-09-14 NOTE — MAU Note (Signed)
Transfer from ER. When used the restroom this morning, she saw blood.  Has a pad on, has seen a little bit of red blood on it. Denies pain. Denies recent intercourse.  Currently on antibiotics for strep in her urine.

## 2021-09-14 NOTE — MAU Provider Note (Signed)
History     CSN: [id number]  Arrival date and time: 09/14/21 [id number]   Event Date/Time   First Provider Initiated Contact with Patient 09/14/21 1126      Chief Complaint  Patient presents with   Vaginal Bleeding   Ms. Melissa Perkins is a 25 y.o. G1P0000 at [redacted]w[redacted]d who presents to MAU for vaginal bleeding which began this morning. Patient reports she went to use the bathroom this morning to urinate and when she wiped she saw some blood on the toilet paper. Patient reports she put on a pad and had very minimal bleeding on the pad after that and now is no longer bleeding. Patient denies abdominal pain.  Pt denies vaginal discharge/odor/itching. Pt denies N/V, abdominal pain, constipation, diarrhea, or urinary problems. Pt denies fever, chills, fatigue, sweating or changes in appetite. Pt denies SOB or chest pain. Pt denies dizziness, HA, light-headedness, weakness.   OB History     Gravida  1   Para  0   Term  0   Preterm  0   AB  0   Living  0      SAB  0   IAB  0   Ectopic  0   Multiple  0   Live Births  0           Past Medical History:  Diagnosis Date   Malaria    Migraines    without aura    Past Surgical History:  Procedure Laterality Date   WISDOM TOOTH EXTRACTION      Family History  Problem Relation Age of Onset   Hypertension Father    Breast cancer Maternal Grandmother    Prostate cancer Maternal Grandfather    Colon cancer Maternal Grandfather    Heart attack Paternal Grandmother    Obesity Paternal Grandmother    Stroke Paternal Grandfather    Vascular Disease Paternal Grandfather     Social History   Tobacco Use   Smoking status: Never   Smokeless tobacco: Never  Vaping Use   Vaping Use: Never used  Substance Use Topics   Alcohol use: Not Currently    Comment: occ   Drug use: Never    Allergies:  Allergies  Allergen Reactions   Amoxicillin Rash    Medications Prior to Admission  Medication Sig Dispense Refill  Last Dose   Prenatal Vit-Fe Fumarate-FA (PNV PRENATAL PLUS MULTIVITAMIN) 27-1 MG TABS Take 1 tablet by mouth daily. 30 tablet 0 09/14/2021   Cholecalciferol (VITAMIN D3) 1.25 MG (50000 UT) CAPS Take 1 capsule by mouth once a week. 6 capsule 0    Cholecalciferol (VITAMIN D3) 50 MCG (2000 UT) capsule Take 1 capsule (2,000 Units total) by mouth daily. 100 capsule 3    metoCLOPramide (REGLAN) 10 MG tablet Take 1 tablet (10 mg total) by mouth every 8 (eight) hours as needed for nausea or vomiting. (Patient not taking: Reported on 07/23/2021) 90 tablet 0    Nutritional Supplements (VITAMIN D BOOSTER PO) Take by mouth.       Review of Systems  Constitutional:  Negative for chills, diaphoresis, fatigue and fever.  Eyes:  Negative for visual disturbance.  Respiratory:  Negative for shortness of breath.   Cardiovascular:  Negative for chest pain.  Gastrointestinal:  Negative for abdominal pain, constipation, diarrhea, nausea and vomiting.  Genitourinary:  Positive for vaginal bleeding. Negative for dysuria, flank pain, frequency, pelvic pain, urgency and vaginal discharge.  Neurological:  Negative for dizziness, weakness, light-headedness and headaches.  Physical Exam   Blood pressure 111/72, pulse 96, temperature 98.3 F (36.8 C), resp. rate 17, height 5\' 1"  (1.549 m), weight 79.5 kg, last menstrual period 06/09/2021, SpO2 99 %.  Patient Vitals for the past 24 hrs:  BP Temp Pulse Resp SpO2 Height Weight  09/14/21 1333 111/72 -- 96 -- -- -- --  09/14/21 1104 (!) 144/77 -- 92 17 99 % -- --  09/14/21 1030 123/73 -- 89 17 100 % 5\' 1"  (1.549 m) 79.5 kg  09/14/21 0947 107/88 98.3 F (36.8 C) (!) 119 18 97 % -- --   Physical Exam Vitals and nursing note reviewed.  Constitutional:      General: She is not in acute distress.    Appearance: Normal appearance. She is not ill-appearing, toxic-appearing or diaphoretic.  HENT:     Head: Normocephalic and atraumatic.  Pulmonary:     Effort: Pulmonary  effort is normal.  Neurological:     Mental Status: She is alert and oriented to person, place, and time.  Psychiatric:        Mood and Affect: Mood normal.        Behavior: Behavior normal.        Thought Content: Thought content normal.        Judgment: Judgment normal.   Results for orders placed or performed during the hospital encounter of 09/14/21 (from the past 24 hour(s))  POC urine preg, ED     Status: Abnormal   Collection Time: 09/14/21  9:45 AM  Result Value Ref Range   Preg Test, Ur POSITIVE (A) NEGATIVE  ABO/Rh     Status: None   Collection Time: 09/14/21 11:44 AM  Result Value Ref Range   ABO/RH(D) O POS    No rh immune globuloin      NOT A RH IMMUNE GLOBULIN CANDIDATE, PT RH POSITIVE Performed at [facility] [address], [place], [place] [address]    US OB LESS THAN 14 WEEKS WITH OB TRANSVAGINAL  Result Date: 09/14/2021 CLINICAL DATA:  Vaginal bleeding EXAM: OBSTETRIC <14 WK [place] AND TRANSVAGINAL OB US TECHNIQUE: Both transabdominal and transvaginal ultrasound examinations were performed for complete evaluation of the gestation as well as the maternal uterus, adnexal regions, and pelvic cul-de-sac. Transvaginal technique was performed to assess early pregnancy. COMPARISON:  None. FINDINGS: Intrauterine gestational sac: Single Yolk sac:  Not Visualized. Embryo:  Visualized. Cardiac Activity: Visualized. Heart Rate: 148  bpm CRL: 8.36  mm   14 w   1 d                  [place] EDC: 03/14/22 Subchorionic hemorrhage:  None visualized. Maternal uterus/adnexae: Normal appearance of the bilateral ovaries and uterus. Cervical length measures 3.79 cm. No free fluid in the pelvis. IMPRESSION: Single viable intrauterine pregnancy without evidence of complication. Electronically Signed   By: Audie Pinto M.D.   On: 09/14/2021 12:57    MAU Course  Procedures  MDM -VB in pregnancy -ABO: O Positive -[place]: no [place], CL 3.79, WNL -WetPrep/GC/CT self-collected by patient -pt  declines cervical exam -Dr. Alwyn Pea notified of elevated BP in MAU, per Dr. Alwyn Pea, pt can return to office in next two weeks. Pt states next office appointment is tomorrow. -pt discharged to home in stable condition  Orders Placed This Encounter  Procedures   Wet prep, genital    Standing Status:   Standing    Number of Occurrences:   1   US OB LESS THAN  14 WEEKS WITH OB TRANSVAGINAL    Please perform cervical length.    Standing Status:   Standing    Number of Occurrences:   1    Order Specific Question:   Symptom/Reason for Exam    Answer:   Vaginal bleeding in pregnancy [[id number]]   POC urine preg, ED    Standing Status:   Standing    Number of Occurrences:   1   ABO/Rh    Standing Status:   Standing    Number of Occurrences:   1   Discharge patient    Order Specific Question:   Discharge disposition    Answer:   01-Home or Self Care [1]    Order Specific Question:   Discharge patient date    Answer:   09/14/2021   No orders of the defined types were placed in this encounter.  Assessment and Plan   1. Vaginal bleeding in pregnancy   2. [redacted] weeks gestation of pregnancy   3. Blood type, Rh positive   4. Elevated blood pressure reading without diagnosis of hypertension    Allergies as of 09/14/2021       Reactions   Amoxicillin Rash        Medication List     TAKE these medications    metoCLOPramide 10 MG tablet Commonly known as: REGLAN Take 1 tablet (10 mg total) by mouth every 8 (eight) hours as needed for nausea or vomiting.   PNV Prenatal Plus Multivitamin 27-1 MG Tabs Take 1 tablet by mouth daily.   VITAMIN D BOOSTER PO Take by mouth.   Vitamin D3 50 MCG (2000 UT) capsule Take 1 capsule (2,000 Units total) by mouth daily.   Vitamin D3 1.25 MG (50000 UT) Caps Take 1 capsule by mouth once a week.       -return MAU precautions given -pt discharged to home in stable condition  Joni Reining E Yusuke Beza 09/14/2021, 1:51 PM

## 2021-09-14 NOTE — ED Triage Notes (Signed)
Pt states she is [redacted] weeks pregnant.  Reports small amount of vaginal bleeding this morning that has stopped.  Denies pain.  Report called to Alana in MAU.

## 2021-09-14 NOTE — ED Provider Notes (Signed)
Emergency Medicine Provider Triage Evaluation Note  Melissa Perkins , a 25 y.o. female  was evaluated in triage.  Patient is a G1, P0.  She is approximately [redacted] weeks pregnant and has been following with an OB/GYN.  She reports today that she had some vaginal bleeding this morning.  It filled up an entire piece of toilet paper and she is wearing a pad now.  States the bleeding has now stopped.  She denies any abdominal cramping or pain.  She also denies any abnormal vaginal discharge.  Review of Systems  Positive: Vaginal bleeding Negative: Abd pain  Physical Exam  BP 107/88 (BP Location: Right Arm)   Pulse (!) 119   Temp 98.3 F (36.8 C)   Resp 18   LMP 06/09/2021   SpO2 97%  Gen:   Awake, no distress   Resp:  Normal effort  MSK:   Moves extremities without difficulty  Other:  Abdomen nontender  Medical Decision Making  Medically screening exam initiated at 9:50 AM.  Appropriate orders placed.  ROSAMUND NYLAND was informed that the remainder of the evaluation will be completed by another provider, this initial triage assessment does not replace that evaluation, and the importance of remaining in the ED until their evaluation is complete.  Will call MAU. Spoke with Denny Peon, APP and will accept patient.     Claudie Leach, PA-C 09/14/21 0722    Vanetta Mulders, MD 09/14/21 1536

## 2021-09-15 LAB — GC/CHLAMYDIA PROBE AMP (~~LOC~~) NOT AT ARMC
Chlamydia: NEGATIVE
Comment: NEGATIVE
Comment: NORMAL
Neisseria Gonorrhea: NEGATIVE

## 2021-12-21 IMAGING — US US OB < 14 WEEKS - US OB TV
1 series · 15 of 19 positions shown · non-contrast
Comparison: None.

CLINICAL DATA: Vaginal bleeding

EXAM:
OBSTETRIC <14 WK US AND TRANSVAGINAL OB US
TECHNIQUE: Both transabdominal and transvaginal ultrasound examinations were
performed for complete evaluation of the gestation as well as the
maternal uterus, adnexal regions, and pelvic cul-de-sac.
Transvaginal technique was performed to assess early pregnancy.

[Series 1: us ob < 14 weeks - us ob tv · 15 of 19 slices shown]
[im 1/19]
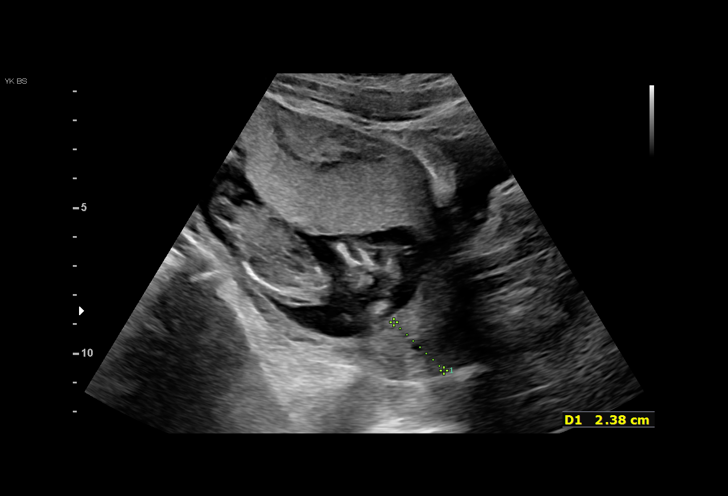
[im 2/19]
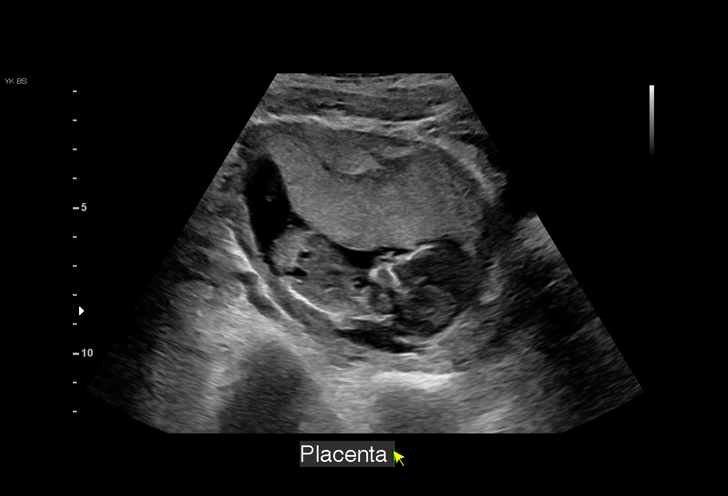
[im 4/19]
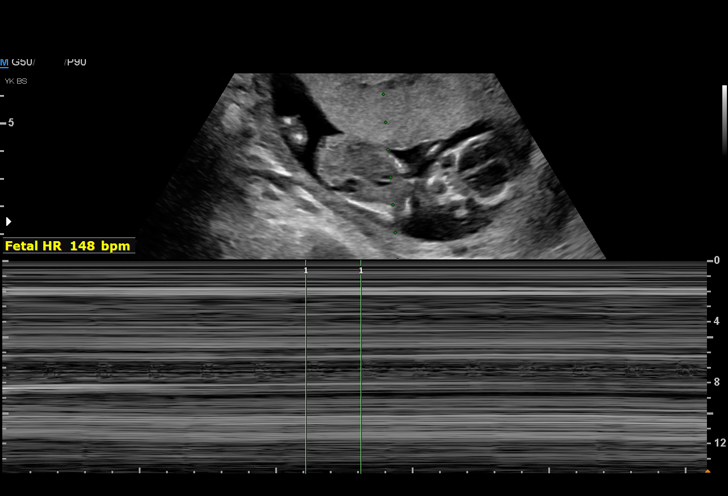
[im 5/19]
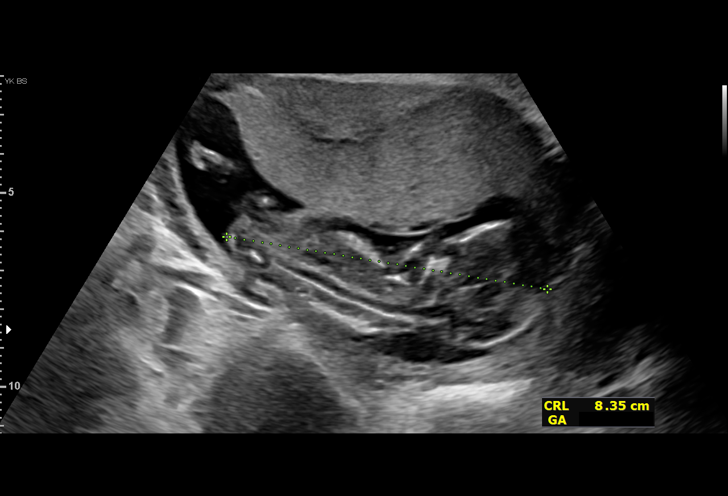
[im 6/19]
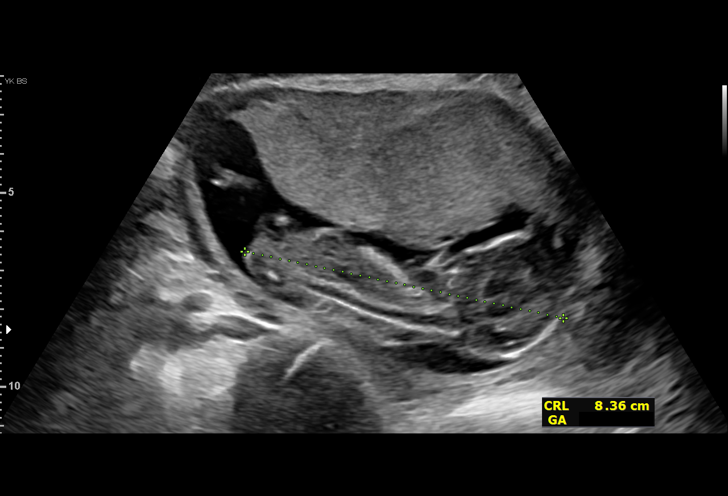
[im 7/19]
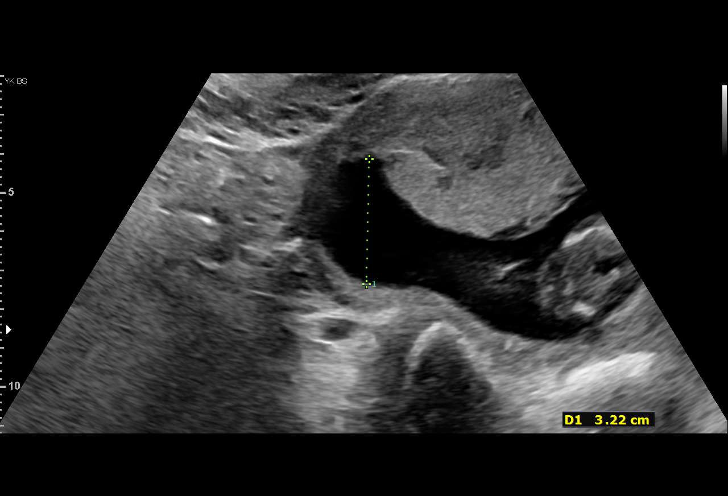
[im 9/19]
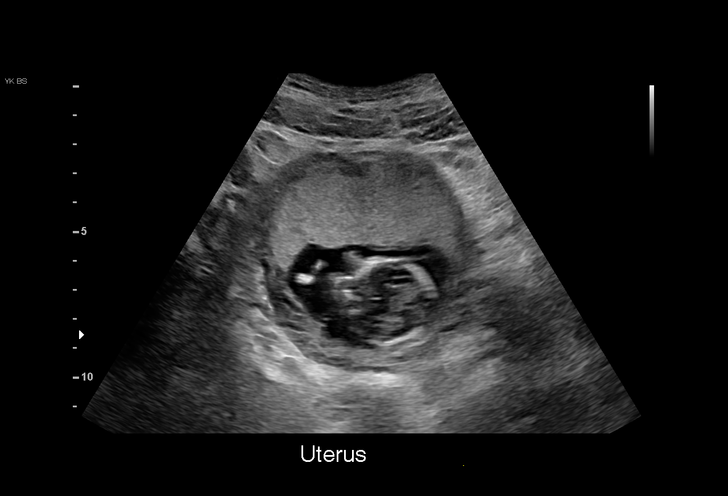
[im 10/19]
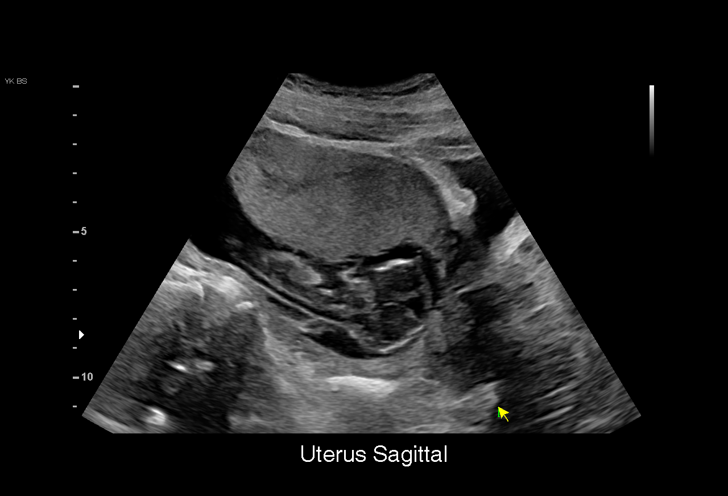
[im 11/19]
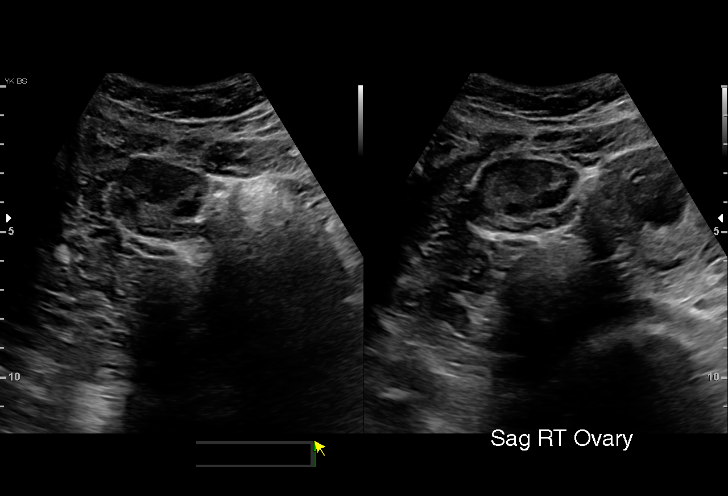
[im 13/19]
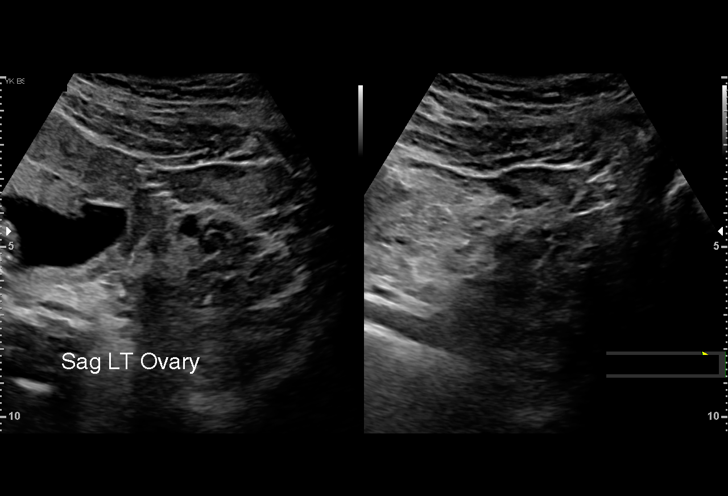
[im 14/19]
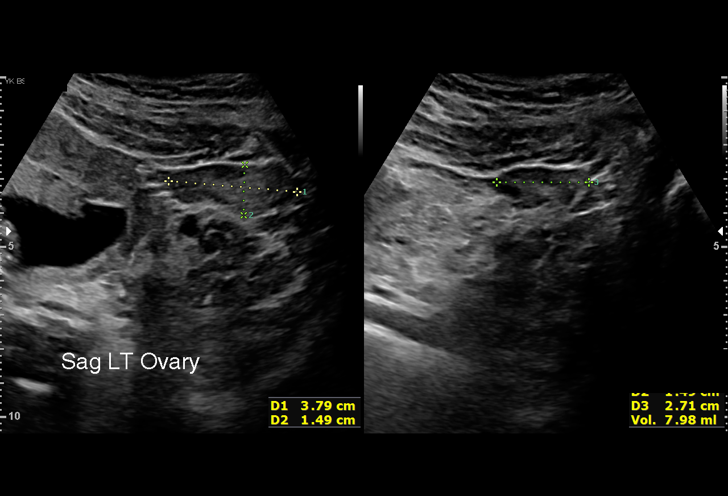
[im 15/19]
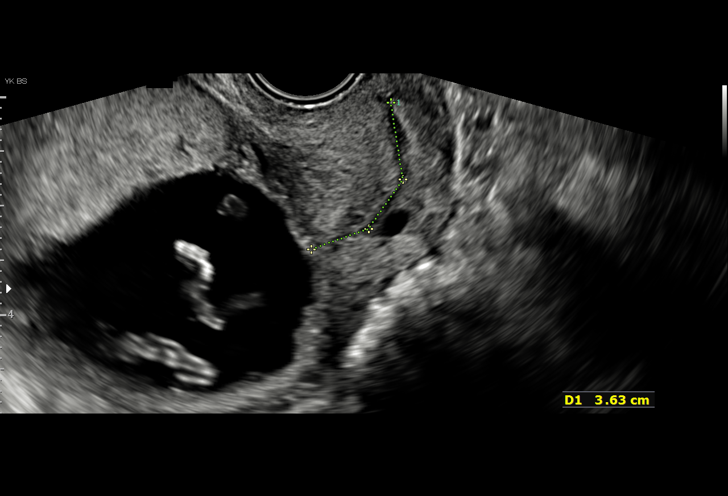
[im 16/19]
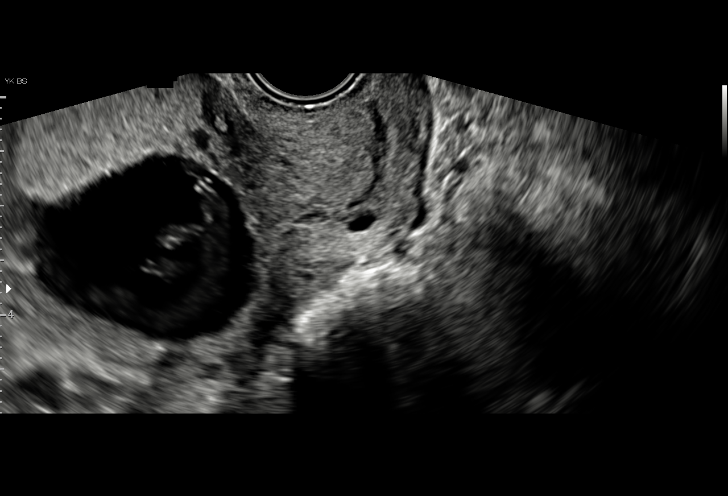
[im 18/19]
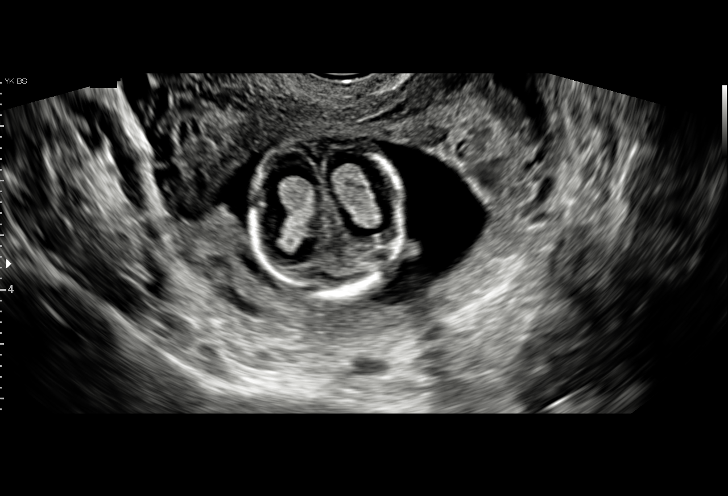
[im 19/19]
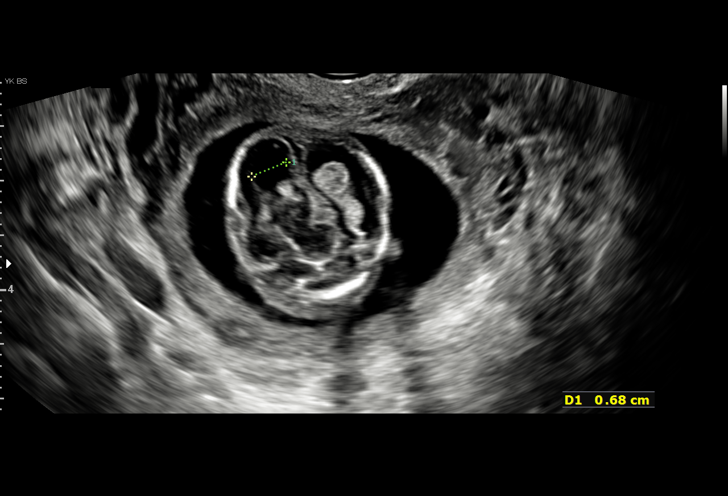

[15 of 19 positions shown; findings below may reference images not displayed]

FINDINGS: Intrauterine gestational sac: Single

Yolk sac:  Not Visualized.

Embryo:  Visualized.

Cardiac Activity: Visualized.

Heart Rate: 148  bpm

CRL: 8.36  mm   14 w   1 d                  US EDC: 03/14/22

Subchorionic hemorrhage:  None visualized.

Maternal uterus/adnexae: Normal appearance of the bilateral ovaries
and uterus. Cervical length measures 3.79 cm. No free fluid in the
pelvis.
IMPRESSION: Single viable intrauterine pregnancy without evidence of
complication.

## 2022-02-28 ENCOUNTER — Other Ambulatory Visit: Payer: Self-pay

## 2022-02-28 ENCOUNTER — Inpatient Hospital Stay (HOSPITAL_COMMUNITY): Payer: Managed Care, Other (non HMO) | Admitting: Anesthesiology

## 2022-02-28 ENCOUNTER — Encounter (HOSPITAL_COMMUNITY): Payer: Self-pay | Admitting: Obstetrics and Gynecology

## 2022-02-28 ENCOUNTER — Encounter (HOSPITAL_COMMUNITY)
Admission: AD | Disposition: A | Discharge status: Home / Self Care | Payer: Self-pay | Source: Home / Self Care | Attending: Obstetrics and Gynecology

## 2022-02-28 ENCOUNTER — Inpatient Hospital Stay (HOSPITAL_COMMUNITY)
Admission: AD | Admit: 2022-02-28 | Discharge: 2022-03-03 | DRG: 787 | Disposition: A | Discharge status: Home / Self Care | Payer: Managed Care, Other (non HMO) | Attending: Obstetrics and Gynecology | Admitting: Obstetrics and Gynecology

## 2022-02-28 DIAGNOSIS — Z3A37 37 weeks gestation of pregnancy: Secondary | ICD-10-CM

## 2022-02-28 DIAGNOSIS — O322XX0 Maternal care for transverse and oblique lie, not applicable or unspecified: Secondary | ICD-10-CM | POA: Diagnosis present

## 2022-02-28 DIAGNOSIS — O4292 Full-term premature rupture of membranes, unspecified as to length of time between rupture and onset of labor: Principal | ICD-10-CM | POA: Diagnosis present

## 2022-02-28 DIAGNOSIS — O99824 Streptococcus B carrier state complicating childbirth: Secondary | ICD-10-CM | POA: Diagnosis present

## 2022-02-28 DIAGNOSIS — O99214 Obesity complicating childbirth: Secondary | ICD-10-CM | POA: Diagnosis present

## 2022-02-28 DIAGNOSIS — D6959 Other secondary thrombocytopenia: Secondary | ICD-10-CM | POA: Diagnosis present

## 2022-02-28 DIAGNOSIS — O99892 Other specified diseases and conditions complicating childbirth: Secondary | ICD-10-CM | POA: Diagnosis present

## 2022-02-28 DIAGNOSIS — R000 Tachycardia, unspecified: Secondary | ICD-10-CM | POA: Diagnosis present

## 2022-02-28 DIAGNOSIS — O9912 Other diseases of the blood and blood-forming organs and certain disorders involving the immune mechanism complicating childbirth: Secondary | ICD-10-CM | POA: Diagnosis present

## 2022-02-28 LAB — CBC
HCT: 36.1 % (ref 36.0–46.0)
Hemoglobin: 12 g/dL (ref 12.0–15.0)
MCH: 29 pg (ref 26.0–34.0)
MCHC: 33.2 g/dL (ref 30.0–36.0)
MCV: 87.2 fL (ref 80.0–100.0)
Platelets: 147 10*3/uL — ABNORMAL LOW (ref 150–400)
RBC: 4.14 MIL/uL (ref 3.87–5.11)
RDW: 22.2 % — ABNORMAL HIGH (ref 11.5–15.5)
WBC: 9.6 10*3/uL (ref 4.0–10.5)
nRBC: 0 % (ref 0.0–0.2)

## 2022-02-28 LAB — TYPE AND SCREEN
ABO/RH(D): O POS
Antibody Screen: NEGATIVE

## 2022-02-28 LAB — POCT FERN TEST: POCT Fern Test: POSITIVE

## 2022-02-28 SURGERY — Surgical Case
Anesthesia: Spinal

## 2022-02-28 MED ORDER — OXYTOCIN-SODIUM CHLORIDE 30-0.9 UT/500ML-% IV SOLN: 2.5000 [IU]/h | INTRAVENOUS | Status: DC

## 2022-02-28 MED ORDER — DIPHENOXYLATE-ATROPINE 2.5-0.025 MG/5ML PO LIQD
10.0000 mL | Freq: Four times a day (QID) | ORAL | Status: DC | PRN
  Administered 2022-02-28: 10 mL via ORAL
  Filled 2022-02-28: qty 10

## 2022-02-28 MED ORDER — MORPHINE SULFATE (PF) 0.5 MG/ML IJ SOLN
INTRAMUSCULAR | Status: AC
  Filled 2022-02-28: qty 10

## 2022-02-28 MED ORDER — ONDANSETRON HCL 4 MG/2ML IJ SOLN
INTRAMUSCULAR | Status: DC | PRN
  Administered 2022-02-28: 4 mg via INTRAVENOUS

## 2022-02-28 MED ORDER — MORPHINE SULFATE (PF) 0.5 MG/ML IJ SOLN: INTRAMUSCULAR | Status: DC | PRN

## 2022-02-28 MED ORDER — MENTHOL 3 MG MT LOZG: 1.0000 | LOZENGE | OROMUCOSAL | Status: DC | PRN

## 2022-02-28 MED ORDER — OXYCODONE-ACETAMINOPHEN 5-325 MG PO TABS: 2.0000 | ORAL_TABLET | ORAL | Status: DC | PRN

## 2022-02-28 MED ORDER — OXYTOCIN-SODIUM CHLORIDE 30-0.9 UT/500ML-% IV SOLN
INTRAVENOUS | Status: DC | PRN
  Administered 2022-02-28: 300 mL via INTRAVENOUS

## 2022-02-28 MED ORDER — ACETAMINOPHEN 10 MG/ML IV SOLN
INTRAVENOUS | Status: AC
  Filled 2022-02-28: qty 100

## 2022-02-28 MED ORDER — CLINDAMYCIN PHOSPHATE 900 MG/50ML IV SOLN
900.0000 mg | Freq: Three times a day (TID) | INTRAVENOUS | Status: DC
  Administered 2022-02-28 (×2): 900 mg via INTRAVENOUS
  Filled 2022-02-28: qty 50

## 2022-02-28 MED ORDER — PHENYLEPHRINE HCL-NACL 20-0.9 MG/250ML-% IV SOLN
INTRAVENOUS | Status: DC | PRN
  Administered 2022-02-28: 90 ug/min via INTRAVENOUS
  Administered 2022-02-28: 60 ug/min via INTRAVENOUS

## 2022-02-28 MED ORDER — FENTANYL CITRATE (PF) 100 MCG/2ML IJ SOLN: 25.0000 ug | INTRAMUSCULAR | Status: DC | PRN

## 2022-02-28 MED ORDER — PRENATAL MULTIVITAMIN CH
1.0000 | ORAL_TABLET | Freq: Every day | ORAL | Status: DC
  Administered 2022-03-01 – 2022-03-02 (×2): 1 via ORAL
  Filled 2022-02-28 (×2): qty 1

## 2022-02-28 MED ORDER — METHYLERGONOVINE MALEATE 0.2 MG/ML IJ SOLN: 0.2000 mg | INTRAMUSCULAR | Status: DC | PRN

## 2022-02-28 MED ORDER — FENTANYL CITRATE (PF) 100 MCG/2ML IJ SOLN
INTRAMUSCULAR | Status: DC | PRN
  Administered 2022-02-28: 15 ug via INTRATHECAL

## 2022-02-28 MED ORDER — SIMETHICONE 80 MG PO CHEW
80.0000 mg | CHEWABLE_TABLET | Freq: Three times a day (TID) | ORAL | Status: DC
  Administered 2022-03-01 – 2022-03-02 (×4): 80 mg via ORAL
  Filled 2022-02-28 (×4): qty 1

## 2022-02-28 MED ORDER — OXYTOCIN-SODIUM CHLORIDE 30-0.9 UT/500ML-% IV SOLN
1.0000 m[IU]/min | INTRAVENOUS | Status: DC
  Administered 2022-02-28: 8 m[IU]/min via INTRAVENOUS
  Administered 2022-02-28: 10 m[IU]/min via INTRAVENOUS
  Administered 2022-02-28: 2 m[IU]/min via INTRAVENOUS
  Administered 2022-02-28: 6 m[IU]/min via INTRAVENOUS
  Administered 2022-02-28: 4 m[IU]/min via INTRAVENOUS
  Filled 2022-02-28: qty 500

## 2022-02-28 MED ORDER — OXYTOCIN-SODIUM CHLORIDE 30-0.9 UT/500ML-% IV SOLN: 2.5000 [IU]/h | INTRAVENOUS | Status: AC

## 2022-02-28 MED ORDER — CLINDAMYCIN PHOSPHATE 900 MG/50ML IV SOLN
INTRAVENOUS | Status: AC
  Filled 2022-02-28: qty 50

## 2022-02-28 MED ORDER — OXYCODONE-ACETAMINOPHEN 5-325 MG PO TABS: 1.0000 | ORAL_TABLET | ORAL | Status: DC | PRN

## 2022-02-28 MED ORDER — METHYLERGONOVINE MALEATE 0.2 MG/ML IJ SOLN
INTRAMUSCULAR | Status: AC
  Filled 2022-02-28: qty 1

## 2022-02-28 MED ORDER — DEXAMETHASONE SODIUM PHOSPHATE 10 MG/ML IJ SOLN
INTRAMUSCULAR | Status: DC | PRN
  Administered 2022-02-28: 10 mg via INTRAVENOUS

## 2022-02-28 MED ORDER — ACETAMINOPHEN 500 MG PO TABS
1000.0000 mg | ORAL_TABLET | Freq: Four times a day (QID) | ORAL | Status: DC
  Administered 2022-03-01 – 2022-03-03 (×9): 1000 mg via ORAL
  Filled 2022-02-28 (×9): qty 2

## 2022-02-28 MED ORDER — DIPHENHYDRAMINE HCL 25 MG PO CAPS
25.0000 mg | ORAL_CAPSULE | Freq: Four times a day (QID) | ORAL | Status: DC | PRN
  Administered 2022-03-01: 25 mg via ORAL
  Filled 2022-02-28: qty 1

## 2022-02-28 MED ORDER — CARBOPROST TROMETHAMINE 250 MCG/ML IM SOLN
INTRAMUSCULAR | Status: AC
  Filled 2022-02-28: qty 1

## 2022-02-28 MED ORDER — METHYLERGONOVINE MALEATE 0.2 MG/ML IJ SOLN
INTRAMUSCULAR | Status: DC | PRN
  Administered 2022-02-28: .2 mg via INTRAMUSCULAR

## 2022-02-28 MED ORDER — BUPIVACAINE IN DEXTROSE 0.75-8.25 % IT SOLN
INTRATHECAL | Status: DC | PRN
  Administered 2022-02-28: 1.6 mL via INTRATHECAL

## 2022-02-28 MED ORDER — DIBUCAINE (PERIANAL) 1 % EX OINT: 1.0000 "application " | TOPICAL_OINTMENT | CUTANEOUS | Status: DC | PRN

## 2022-02-28 MED ORDER — LACTATED RINGERS IV SOLN: INTRAVENOUS | Status: DC

## 2022-02-28 MED ORDER — ONDANSETRON HCL 4 MG/2ML IJ SOLN: 4.0000 mg | Freq: Four times a day (QID) | INTRAMUSCULAR | Status: DC | PRN

## 2022-02-28 MED ORDER — DEXAMETHASONE SODIUM PHOSPHATE 10 MG/ML IJ SOLN
INTRAMUSCULAR | Status: AC
  Filled 2022-02-28: qty 1

## 2022-02-28 MED ORDER — METHYLERGONOVINE MALEATE 0.2 MG PO TABS: 0.2000 mg | ORAL_TABLET | ORAL | Status: DC | PRN

## 2022-02-28 MED ORDER — MORPHINE SULFATE (PF) 0.5 MG/ML IJ SOLN
INTRAMUSCULAR | Status: DC | PRN
  Administered 2022-02-28: .15 mg via INTRATHECAL

## 2022-02-28 MED ORDER — IBUPROFEN 600 MG PO TABS
600.0000 mg | ORAL_TABLET | Freq: Four times a day (QID) | ORAL | Status: DC
  Administered 2022-02-28 – 2022-03-03 (×10): 600 mg via ORAL
  Filled 2022-02-28 (×10): qty 1

## 2022-02-28 MED ORDER — WITCH HAZEL-GLYCERIN EX PADS: 1.0000 "application " | MEDICATED_PAD | CUTANEOUS | Status: DC | PRN

## 2022-02-28 MED ORDER — ZOLPIDEM TARTRATE 5 MG PO TABS: 5.0000 mg | ORAL_TABLET | Freq: Every evening | ORAL | Status: DC | PRN

## 2022-02-28 MED ORDER — CARBOPROST TROMETHAMINE 250 MCG/ML IM SOLN
250.0000 ug | Freq: Once | INTRAMUSCULAR | Status: AC
Start: 2022-02-28 — End: 2022-02-28
  Administered 2022-02-28: 250 ug via INTRAMUSCULAR

## 2022-02-28 MED ORDER — TERBUTALINE SULFATE 1 MG/ML IJ SOLN: 0.2500 mg | Freq: Once | INTRAMUSCULAR | Status: DC | PRN

## 2022-02-28 MED ORDER — SODIUM CHLORIDE 0.9 % IR SOLN
Status: DC | PRN
  Administered 2022-02-28: 1

## 2022-02-28 MED ORDER — ONDANSETRON HCL 4 MG/2ML IJ SOLN: 4.0000 mg | Freq: Once | INTRAMUSCULAR | Status: DC | PRN

## 2022-02-28 MED ORDER — DEXMEDETOMIDINE (PRECEDEX) IN NS 20 MCG/5ML (4 MCG/ML) IV SYRINGE
PREFILLED_SYRINGE | INTRAVENOUS | Status: DC | PRN
  Administered 2022-02-28 (×2): 8 ug via INTRAVENOUS

## 2022-02-28 MED ORDER — BUPIVACAINE HCL (PF) 0.25 % IJ SOLN
INTRAMUSCULAR | Status: AC
  Filled 2022-02-28: qty 30

## 2022-02-28 MED ORDER — LIDOCAINE HCL (PF) 1 % IJ SOLN: 30.0000 mL | INTRAMUSCULAR | Status: DC | PRN

## 2022-02-28 MED ORDER — PHENYLEPHRINE HCL-NACL 20-0.9 MG/250ML-% IV SOLN
INTRAVENOUS | Status: AC
  Filled 2022-02-28: qty 250

## 2022-02-28 MED ORDER — OXYTOCIN-SODIUM CHLORIDE 30-0.9 UT/500ML-% IV SOLN
INTRAVENOUS | Status: AC
  Filled 2022-02-28: qty 500

## 2022-02-28 MED ORDER — SIMETHICONE 80 MG PO CHEW: 80.0000 mg | CHEWABLE_TABLET | ORAL | Status: DC | PRN

## 2022-02-28 MED ORDER — OXYCODONE HCL 5 MG PO TABS
5.0000 mg | ORAL_TABLET | ORAL | Status: DC | PRN
  Administered 2022-03-01: 5 mg via ORAL
  Administered 2022-03-01: 10 mg via ORAL
  Administered 2022-03-01: 5 mg via ORAL
  Administered 2022-03-03: 10 mg via ORAL
  Filled 2022-02-28: qty 2
  Filled 2022-02-28 (×2): qty 1
  Filled 2022-02-28: qty 2

## 2022-02-28 MED ORDER — LACTATED RINGERS IV SOLN
500.0000 mL | INTRAVENOUS | Status: DC | PRN
  Administered 2022-02-28: 500 mL via INTRAVENOUS
  Administered 2022-02-28: 1000 mL via INTRAVENOUS
  Administered 2022-02-28: 500 mL via INTRAVENOUS

## 2022-02-28 MED ORDER — SENNOSIDES-DOCUSATE SODIUM 8.6-50 MG PO TABS
2.0000 | ORAL_TABLET | ORAL | Status: DC
  Administered 2022-02-28 – 2022-03-02 (×3): 2 via ORAL
  Filled 2022-02-28 (×3): qty 2

## 2022-02-28 MED ORDER — FLEET ENEMA 7-19 GM/118ML RE ENEM: 1.0000 | ENEMA | RECTAL | Status: DC | PRN

## 2022-02-28 MED ORDER — OXYTOCIN-SODIUM CHLORIDE 30-0.9 UT/500ML-% IV SOLN
INTRAVENOUS | Status: AC
Start: 2022-02-28 — End: ?
  Filled 2022-02-28: qty 500

## 2022-02-28 MED ORDER — SOD CITRATE-CITRIC ACID 500-334 MG/5ML PO SOLN
30.0000 mL | ORAL | Status: DC | PRN
  Administered 2022-02-28: 30 mL via ORAL
  Filled 2022-02-28: qty 30

## 2022-02-28 MED ORDER — CLINDAMYCIN PHOSPHATE 900 MG/50ML IV SOLN: 900.0000 mg | Freq: Three times a day (TID) | INTRAVENOUS | Status: DC

## 2022-02-28 MED ORDER — COCONUT OIL OIL: 1.0000 "application " | TOPICAL_OIL | Status: DC | PRN

## 2022-02-28 MED ORDER — ACETAMINOPHEN 325 MG PO TABS: 650.0000 mg | ORAL_TABLET | ORAL | Status: DC | PRN

## 2022-02-28 MED ORDER — OXYTOCIN BOLUS FROM INFUSION: 333.0000 mL | Freq: Once | INTRAVENOUS | Status: DC

## 2022-02-28 MED ORDER — GENTAMICIN SULFATE 40 MG/ML IJ SOLN
5.0000 mg/kg | Freq: Once | INTRAVENOUS | Status: AC
  Administered 2022-02-28: 334.4 mg via INTRAVENOUS
  Filled 2022-02-28: qty 8.25

## 2022-02-28 MED ORDER — ACETAMINOPHEN 10 MG/ML IV SOLN
INTRAVENOUS | Status: DC | PRN
  Administered 2022-02-28: 1000 mg via INTRAVENOUS

## 2022-02-28 MED ORDER — FENTANYL CITRATE (PF) 100 MCG/2ML IJ SOLN
INTRAMUSCULAR | Status: AC
  Filled 2022-02-28: qty 2

## 2022-02-28 MED ORDER — DEXMEDETOMIDINE (PRECEDEX) IN NS 20 MCG/5ML (4 MCG/ML) IV SYRINGE
PREFILLED_SYRINGE | INTRAVENOUS | Status: AC
  Filled 2022-02-28: qty 5

## 2022-02-28 MED ORDER — BUPIVACAINE HCL (PF) 0.25 % IJ SOLN
INTRAMUSCULAR | Status: DC | PRN
  Administered 2022-02-28: 10 mL

## 2022-02-28 SURGICAL SUPPLY — 46 items
BARRIER ADHS 3X4 INTERCEED (GAUZE/BANDAGES/DRESSINGS) ×2 IMPLANT
BENZOIN TINCTURE PRP APPL 2/3 (GAUZE/BANDAGES/DRESSINGS) ×1 IMPLANT
CHLORAPREP W/TINT 26ML (MISCELLANEOUS) ×2 IMPLANT
CLAMP CORD UMBIL (MISCELLANEOUS) IMPLANT
CLOSURE STERI STRIP 1/2 X4 (GAUZE/BANDAGES/DRESSINGS) IMPLANT
CLOTH BEACON ORANGE TIMEOUT ST (SAFETY) ×2 IMPLANT
DRAPE C SECTION CLR SCREEN (DRAPES) ×2 IMPLANT
DRSG OPSITE POSTOP 4X10 (GAUZE/BANDAGES/DRESSINGS) ×2 IMPLANT
ELECT REM PT RETURN 9FT ADLT (ELECTROSURGICAL) ×2
ELECTRODE REM PT RTRN 9FT ADLT (ELECTROSURGICAL) ×1 IMPLANT
EXTRACTOR VACUUM M CUP 4 TUBE (SUCTIONS) IMPLANT
GAUZE PAD ABD 8X10 STRL (GAUZE/BANDAGES/DRESSINGS) ×1 IMPLANT
GAUZE SPONGE 4X4 12PLY STRL LF (GAUZE/BANDAGES/DRESSINGS) ×2 IMPLANT
GLOVE BIOGEL PI IND STRL 7.0 (GLOVE) ×2 IMPLANT
GLOVE BIOGEL PI INDICATOR 7.0 (GLOVE) ×2
GLOVE ECLIPSE 6.5 STRL STRAW (GLOVE) ×2 IMPLANT
GOWN STRL REUS W/TWL LRG LVL3 (GOWN DISPOSABLE) ×4 IMPLANT
KIT ABG SYR 3ML LUER SLIP (SYRINGE) IMPLANT
NDL HYPO 25X5/8 SAFETYGLIDE (NEEDLE) IMPLANT
NEEDLE HYPO 22GX1.5 SAFETY (NEEDLE) ×2 IMPLANT
NEEDLE HYPO 25X5/8 SAFETYGLIDE (NEEDLE) IMPLANT
NS IRRIG 1000ML POUR BTL (IV SOLUTION) ×2 IMPLANT
PACK C SECTION WH (CUSTOM PROCEDURE TRAY) ×2 IMPLANT
PAD OB MATERNITY 4.3X12.25 (PERSONAL CARE ITEMS) ×2 IMPLANT
RTRCTR C-SECT PINK 25CM LRG (MISCELLANEOUS) IMPLANT
STAPLER VISISTAT 35W (STAPLE) ×1 IMPLANT
STRIP CLOSURE SKIN 1/2X4 (GAUZE/BANDAGES/DRESSINGS) IMPLANT
SUT CHROMIC GUT AB #0 18 (SUTURE) IMPLANT
SUT MNCRL 0 VIOLET CTX 36 (SUTURE) ×3 IMPLANT
SUT MON AB 2-0 SH 27 (SUTURE)
SUT MON AB 2-0 SH27 (SUTURE) IMPLANT
SUT MON AB 3-0 SH 27 (SUTURE)
SUT MON AB 3-0 SH27 (SUTURE) IMPLANT
SUT MON AB 4-0 PS1 27 (SUTURE) IMPLANT
SUT MONOCRYL 0 CTX 36 (SUTURE) ×3
SUT PLAIN 2 0 (SUTURE)
SUT PLAIN 2 0 XLH (SUTURE) IMPLANT
SUT PLAIN ABS 2-0 CT1 27XMFL (SUTURE) IMPLANT
SUT VIC AB 0 CT1 36 (SUTURE) ×4 IMPLANT
SUT VIC AB 2-0 CT1 27 (SUTURE) ×1
SUT VIC AB 2-0 CT1 TAPERPNT 27 (SUTURE) ×1 IMPLANT
SUT VIC AB 4-0 PS2 27 (SUTURE) IMPLANT
SYR CONTROL 10ML LL (SYRINGE) ×2 IMPLANT
TOWEL OR 17X24 6PK STRL BLUE (TOWEL DISPOSABLE) ×2 IMPLANT
TRAY FOLEY W/BAG SLVR 14FR LF (SET/KITS/TRAYS/PACK) IMPLANT
WATER STERILE IRR 1000ML POUR (IV SOLUTION) ×2 IMPLANT

## 2022-02-28 NOTE — H&P (Signed)
Melissa Perkins is a 26 y.o. female presenting @ 37 5/[redacted] weeks gestation with SROM clear fluid @ 7:30 am. GBS cx (+) sensitive to Clindamycin. (+) FM. ?OB History   ? ? Gravida  ?1  ? Para  ?0  ? Term  ?0  ? Preterm  ?0  ? AB  ?0  ? Living  ?0  ?  ? ? SAB  ?0  ? IAB  ?0  ? Ectopic  ?0  ? Multiple  ?0  ? Live Births  ?0  ?   ?  ?  ? ?Past Medical History:  ?Diagnosis Date  ? Malaria   ? Migraines   ? without aura  ? ?Past Surgical History:  ?Procedure Laterality Date  ? WISDOM TOOTH EXTRACTION    ? ?Family History: family history includes Breast cancer in her maternal grandmother; Colon cancer in her maternal grandfather; Heart attack in her paternal grandmother; Hypertension in her father; Obesity in her paternal grandmother; Prostate cancer in her maternal grandfather; Stroke in her paternal grandfather; Vascular Disease in her paternal grandfather. ?Social History:  reports that she has never smoked. She has never used smokeless tobacco. She reports that she does not currently use alcohol. She reports that she does not use drugs. ? ? ?  ?Maternal Diabetes: No ?Genetic Screening: Normal ?Maternal Ultrasounds/Referrals: Normal ?Fetal Ultrasounds or other Referrals:  None ?Maternal Substance Abuse:  No ?Significant Maternal Medications:  None ?Significant Maternal Lab Results:  Group B Strep positive ?Other Comments:   followed for marginal CI . Resolved on lat study ? ?Review of Systems  ?All other systems reviewed and are negative. ?History ?Dilation: 1 ?Effacement (%): 60 ?Station: -3 ?Exam by:: Lestine Box, RN ?Blood pressure 123/70, pulse (!) 130, temperature 98.4 ?F (36.9 ?C), temperature source Oral, resp. rate 14, height 5\' 1"  (1.549 m), weight 95.5 kg, last menstrual period 06/09/2021, SpO2 99 %. ?Exam ?Physical Exam ?Constitutional:   ?   Appearance: Normal appearance.  ?HENT:  ?   Head: Atraumatic.  ?Cardiovascular:  ?   Rate and Rhythm: Tachycardia present.  ?Pulmonary:  ?   Effort: Pulmonary effort is normal.   ?Abdominal:  ?   Comments: gravid  ?Musculoskeletal:  ?   Cervical back: Neck supple.  ?Skin: ?   General: Skin is warm and dry.  ?Neurological:  ?   General: No focal deficit present.  ?   Mental Status: She is alert.  ?Psychiatric:     ?   Mood and Affect: Mood normal.  ?  ?Prenatal labs: ?ABO, Rh: --/--/O POS (05/06 1005) ?Antibody: NEG (05/06 1005) ?Rubella:  immune ?RPR:   NR ?HBsAg:   neg ?HIV:   neg ?GBS:   positive ? ?  Latest Ref Rng & Units 02/28/2022  ? 10:30 AM 04/24/2021  ?  3:17 PM 01/09/2020  ?  2:40 PM  ?CBC  ?WBC 4.0 - 10.5 K/uL 9.6   7.0   5.3    ?Hemoglobin 12.0 - 15.0 g/dL 01/11/2020   31.5   17.6    ?Hematocrit 36.0 - 46.0 % 36.1   39.2   37.5    ?Platelets 150 - 400 K/uL 147   235.0   218.0    ? Tracing: baseline 170 (+) accel to 180's good variability ?Ctx q 6 mins ?Assessment/Plan: ?SROM ?GBS cx (+)  ?Fetal tachycardia  thought related to maternal tachycardia ?Pt is afebrile, nl wbc. (+) high anxiety. Reassuring tracing otherwise ?IUP@ 37 5/7 wk ?Gestational thrombocytopenia ?  P) admit routine labs. IV clindamycin. Start Pitocin.  ? ?Makhya Arave A Kester Stimpson ?02/28/2022, 11:16 AM ? ? ? ? ?

## 2022-02-28 NOTE — Transfer of Care (Signed)
Immediate Anesthesia Transfer of Care Note ? ?Patient: Melissa Perkins ? ?Procedure(s) Performed: CESAREAN SECTION ? ?Patient Location: PACU ? ?Anesthesia Type:Spinal ? ?Level of Consciousness: awake ? ?Airway & Oxygen Therapy: Patient Spontanous Breathing ? ?Post-op Assessment: Report given to RN ? ?Post vital signs: Reviewed and stable ? ?Last Vitals:  ?Vitals Value Taken Time  ?BP 127/85 02/28/22 1640  ?Temp    ?Pulse 80 02/28/22 1644  ?Resp 16 02/28/22 1644  ?SpO2 99 % 02/28/22 1644  ?Vitals shown include unvalidated device data. ? ?Last Pain:  ?Vitals:  ? 02/28/22 1315  ?TempSrc:   ?PainSc: 2   ?   ? ?  ? ?Complications: No notable events documented. ?

## 2022-02-28 NOTE — Anesthesia Preprocedure Evaluation (Signed)
Anesthesia Evaluation  ?Patient identified by MRN, date of birth, ID band ?Patient awake ? ? ? ?Reviewed: ?Allergy & Precautions, NPO status , Patient's Chart, lab work & pertinent test results ? ?Airway ?Mallampati: II ? ?TM Distance: >3 FB ?Neck ROM: Full ? ? ? Dental ?no notable dental hx. ?(+) Teeth Intact, Dental Advisory Given ?  ?Pulmonary ?neg pulmonary ROS,  ?  ?Pulmonary exam normal ?breath sounds clear to auscultation ? ? ? ? ? ? Cardiovascular ?negative cardio ROS ?Normal cardiovascular exam ?Rhythm:Regular Rate:Tachycardia ? ? ?  ?Neuro/Psych ? Headaches, negative psych ROS  ? GI/Hepatic ?Neg liver ROS, GERD  ,  ?Endo/Other  ?Morbid obesity ? Renal/GU ?negative Renal ROS  ?negative genitourinary ?  ?Musculoskeletal ? ? Abdominal ?(+) + obese,   ?Peds ? Hematology ? ?(+) Blood dyscrasia, , Thrombocytopenia-mild   ?Anesthesia Other Findings ? ? Reproductive/Obstetrics ?(+) Pregnancy ?Fetal tachycardia ? ?  ? ? ? ? ? ? ? ? ? ? ? ? ? ?  ?  ? ? ? ? ? ? ? ? ?Anesthesia Physical ?Anesthesia Plan ? ?ASA: 3 and emergent ? ?Anesthesia Plan: Spinal  ? ?Post-op Pain Management:   ? ?Induction: Intravenous ? ?PONV Risk Score and Plan: 4 or greater and Scopolamine patch - Pre-op and Treatment may vary due to age or medical condition ? ?Airway Management Planned: Natural Airway ? ?Additional Equipment:  ? ?Intra-op Plan:  ? ?Post-operative Plan:  ? ?Informed Consent: I have reviewed the patients History and Physical, chart, labs and discussed the procedure including the risks, benefits and alternatives for the proposed anesthesia with the patient or authorized representative who has indicated his/her understanding and acceptance.  ? ? ? ?Dental advisory given ? ?Plan Discussed with: CRNA and Anesthesiologist ? ?Anesthesia Plan Comments:   ? ? ? ? ? ? ?Anesthesia Quick Evaluation ? ?

## 2022-02-28 NOTE — Brief Op Note (Signed)
02/28/2022 ? ?4:59 PM ? ?PATIENT:  Domenic Schwab  26 y.o. female ? ?PRE-OPERATIVE DIAGNOSIS:  fetal tachycardia, intrauterine gestation at 37 5/7 wk, SROM, group B strep positive affecting pregnancy ? ?POST-OPERATIVE DIAGNOSIS:  same ? ?PROCEDURE:  Primary Cesarean section, kerr hysterotomy ? ?SURGEON:  Surgeon(s) and Role: ?   Maxie Better, MD - Primary ? ?PHYSICIAN ASSISTANT:  ? ?ASSISTANTS: none  ? ?ANESTHESIA:   spinal ?Findings . Live female  apgar 8/8. Clear AF, nl tubes and ovaries ?EBL:  496 ml ? ?BLOOD ADMINISTERED:none ? ?DRAINS: none  ? ?LOCAL MEDICATIONS USED:  MARCAINE    ? ?SPECIMEN:  Source of Specimen:  placenta ? ?DISPOSITION OF SPECIMEN:  PATHOLOGY ? ?COUNTS:  YES ? ?TOURNIQUET:  * No tourniquets in log * ? ?DICTATION: .Other Dictation: Dictation Number [id number] ? ?PLAN OF CARE: Admit to inpatient  ? ?PATIENT DISPOSITION:  PACU - hemodynamically stable. ?  ?Delay start of Pharmacological VTE agent (>24hrs) due to surgical blood loss or risk of bleeding: no ? ?

## 2022-02-28 NOTE — Progress Notes (Signed)
ANTIBIOTIC CONSULT NOTE - INITIAL ? ?Pharmacy Consult for Gentamicin ?Indication: surgical ppx  ? ?Allergies  ?Allergen Reactions  ? Amoxicillin Rash  ? ? ?Patient Measurements: ?Height: 5\' 1"  (154.9 cm) ?Weight: 95.5 kg (210 lb 9.6 oz) ?IBW/kg (Calculated) : 47.8 ?Adjusted Body Weight: 66.9 kg ? ?Vital Signs: ?Temp: 98.4 ?F (36.9 ?C) (05/06 1031) ?Temp Source: Oral (05/06 1031) ?BP: 150/98 (05/06 1430) ?Pulse Rate: 122 (05/06 1430) ? ?Labs: ?Recent Labs  ?  02/28/22 ?1030  ?WBC 9.6  ?HGB 12.0  ?PLT 147*  ? ?No results for input(s): GENTTROUGH, GENTPEAK, GENTRANDOM in the last 72 hours.  ? ?Microbiology: ?No results found for this or any previous visit (from the past 720 hour(s)). ? ?Medications:  ?Clindamycin 900mg  IV q8hr ? ?Goal of Therapy:  ?Gentamicin peak 20-25 mg/L and Trough < 1 mg/L ? ?Plan:  ?Gentamicin 330 mg (5mg /kg) IV x 1  ? ?Check Scr with next labs if gentamicin continued. ?Will check gentamicin levels if continued > 72hr or clinically indicated. ? ?04/30/22 ?02/28/2022,4:04 PM  ?

## 2022-02-28 NOTE — Anesthesia Procedure Notes (Signed)
Spinal ? ?Patient location during procedure: OR ?Start time: 02/28/2022 3:26 PM ?End time: 02/28/2022 3:30 PM ?Reason for block: surgical anesthesia ?Staffing ?Performed: anesthesiologist  ?Anesthesiologist: Josephine Igo, MD ?Preanesthetic Checklist ?Completed: patient identified, IV checked, site marked, risks and benefits discussed, surgical consent, monitors and equipment checked, pre-op evaluation and timeout performed ?Spinal Block ?Patient position: sitting ?Prep: DuraPrep and site prepped and draped ?Patient monitoring: heart rate, cardiac monitor, continuous pulse ox and blood pressure ?Approach: midline ?Location: L3-4 ?Injection technique: single-shot ?Needle ?Needle type: Pencan  ?Needle gauge: 24 G ?Needle length: 9 cm ?Needle insertion depth: 7 cm ?Assessment ?Sensory level: T4 ?Events: CSF return ?Additional Notes ?Patient tolerated procedure well. Adequate sensory level. ? ? ? ? ? ?

## 2022-02-28 NOTE — Anesthesia Postprocedure Evaluation (Signed)
Anesthesia Post Note ? ?Patient: Melissa Perkins ? ?Procedure(s) Performed: CESAREAN SECTION ? ?  ? ?Patient location during evaluation: PACU ?Anesthesia Type: Spinal ?Level of consciousness: oriented and awake and alert ?Pain management: pain level controlled ?Vital Signs Assessment: post-procedure vital signs reviewed and stable ?Respiratory status: spontaneous breathing, respiratory function stable and nonlabored ventilation ?Cardiovascular status: blood pressure returned to baseline and stable ?Postop Assessment: no headache, no backache, no apparent nausea or vomiting, spinal receding and patient able to bend at knees ?Anesthetic complications: no ? ? ?No notable events documented. ? ?Last Vitals:  ?Vitals:  ? 02/28/22 1730 02/28/22 1745  ?BP: 124/82 (!) 127/91  ?Pulse: 75 74  ?Resp: (!) 23 15  ?Temp: 36.4 ?C   ?SpO2: 100% 99%  ?  ?Last Pain:  ?Vitals:  ? 02/28/22 1745  ?TempSrc:   ?PainSc: 0-No pain  ? ?Pain Goal:   ? ?LLE Motor Response: Purposeful movement (02/28/22 1745) ?  ?RLE Motor Response: Purposeful movement (02/28/22 1745) ?  ?  ?  ?Epidural/Spinal Function Cutaneous sensation: Tingles (02/28/22 1745), Patient able to flex knees: No (02/28/22 1745), Patient able to lift hips off bed: No (02/28/22 1745), Back pain beyond tenderness at insertion site: No (02/28/22 1745), Progressively worsening motor and/or sensory loss: No (02/28/22 1745), Bowel and/or bladder incontinence post epidural: No (02/28/22 1745) ? ?Lavilla Delamora A. ? ? ? ? ?

## 2022-02-28 NOTE — Op Note (Signed)
NAME: Melissa Perkins, Melissa A. ?MEDICAL RECORD NO: [id number] ?ACCOUNT NO: [id number] ?DATE OF BIRTH: [birth year] ?FACILITY: [facility] ?LOCATION: [facility] ?PHYSICIAN: Tracen Mahler A. Garwin Brothers, MD ? ?Operative Report  ? ?DATE OF PROCEDURE: 02/28/2022 ? ?PREOPERATIVE DIAGNOSES:  Fetal tachycardia, intrauterine gestation at 37-5/7 weeks, group B strep positive affecting pregnancy, SROM. ? ?PROCEDURE PERFORMED:  Primary cesarean section, Kerr hysterotomy. ? ?POSTOPERATIVE DIAGNOSES:  Fetal tachycardia, intrauterine gestation at 37-5/7 weeks, group B strep positive affecting pregnancy , SROM. ? ?ANESTHESIA:  Spinal. ? ?SURGEON:  Servando Salina, MD ? ?ASSISTANT:  None. ? ?PROCEDURE:  Under adequate spinal anesthesia, the patient was placed in the supine position with a left lateral tilt.  She was sterilely prepped and draped in the usual fashion.  0.25% Marcaine was injected along the planned Pfannenstiel skin incision  ?site.  Pfannenstiel skin incision was then made, carried down to the rectus fascia.  The rectus fascia was opened transversely.  Rectus fascia was then bluntly and sharply dissected off the rectus muscle in a superior and inferior fashion.  The rectus  ?muscle was split in the midline.  The parietal peritoneum was entered sharply and extended.  A self-retaining Alexis retractor was then placed.  Vesicouterine peritoneum was opened transversely.  The bladder was displaced inferiorly with blunt  ?dissection.  A small curvilinear incision was then made and extended in a cephalic and caudad manner using blunt dissection.  Subsequent delivery of a live female from the left occiput transverse position with nuchal cord x1 which was reducible.  Baby was  ?bulb suctioned on the abdomen.  Cord was delayed clamped x1 minute.  Cord was clamped and cut.  The baby was transferred to the waiting pediatricians who assigned Apgars 8 and 8 at 1 and 5 minutes respectively.  Placenta was manually removed with uterine massage and  ?it was sent  to pathology.  Uterine cavity was cleaned of debris.  Uterine incision had no extension.  It was closed in 2 layers, the first layer with 0 Monocryl running locked stitch, second layer was imbricated using 0 Monocryl suture.  Normal tubes and ? ovaries were noted bilaterally.  The uterus was a little bit atonic. Intraoperative  IM Methergine was given along with uterine massage. The abdomen was irrigated and suctioned of debris.  Alexis retractor was removed.  The parietal peritoneum was closed with  ?2-0 Vicryl.  The rectus fascia was closed with 0 Vicryl x2.  The subcutaneous area was irrigated, small bleeders cauterized.  Due to an emergency in the labor and delivery suite, staples were used to approximate the skin incision. ? ?SPECIMENS:  Placenta sent to pathology. ? ?ESTIMATED BLOOD LOSS: 496 mL. ? ?INTRAOPERATIVE FLUIDS:  1400 mL. ? ?URINE OUTPUT:  25 mL. ? ?Sponge and instrument counts x2 was correct.  Complication was none.  The patient tolerated the procedure well and was transferred to recovery room in stable condition. ? ? ?SHW ?D: 02/28/2022 6:55:34 pm T: 02/28/2022 10:10:00 pm  ?JOB: [id number]  ?

## 2022-02-28 NOTE — MAU Note (Signed)
Melissa Perkins is a 26 y.o. at [redacted]w[redacted]d here in MAU reporting: LOF since 0730, it's clear. No contractions. No bleeding. +FM ? ?Onset of complaint: today ? ?Pain score: 0/10 ? ?FHT:+FM ? ?Lab orders placed from triage: fern test ? ?

## 2022-02-28 NOTE — Progress Notes (Signed)
Melissa Perkins is a 26 y.o. G1P0000 at [redacted]w[redacted]d by ultrasound admitted for rupture of membranes ? ?Subjective: ?Chief Complaint  ?Patient presents with  ? Rupture of Membranes  ? ? ?Objective: ?BP 123/70   Pulse (!) 130   Temp 98.4 ?F (36.9 ?C) (Oral)   Resp 14   Ht 5\' 1"  (1.549 m)   Wt 95.5 kg   LMP 06/09/2021   SpO2 99%   BMI 39.79 kg/m?  ?No intake/output data recorded. ?No intake/output data recorded. ? ?FHT:  FHR: 180 bpm, variability: moderate,  accelerations:  Present,  decelerations:  Absent ?UC:   irregular, every 3-57minutes ?SVE:   deferred ? ? ?Labs: ?Lab Results  ?Component Value Date  ? WBC 9.6 02/28/2022  ? HGB 12.0 02/28/2022  ? HCT 36.1 02/28/2022  ? MCV 87.2 02/28/2022  ? PLT 147 (L) 02/28/2022  ? ? ?Assessment / Plan: ?Fetal tachycardia persistent and unexplained ?Maternal tachycardia w/o fever or evidence of infection ?IIUP@ 37 5/7 wk ?Gestational thrombocytopenia ?Recommend need to proceed with primary C/S for fetal indication ?Pt and partner agreed ?Risk of surgery reviewed including infection, bleeding, injury to bladder,bowel, ureter, possible need for blood transfusion and its risk( HIV, hepatitis,acute rxn). Poss of vaginal delivery in the future depending on uterine scar and preference ?All ? Answered ?P) primary C/S. OR notified ? ? ?Anticipated MOD:   C/S ? ?Haizley Cannella A Zahir Eisenhour ?02/28/2022, 3:23 PM ? ?  ?

## 2022-02-28 NOTE — Lactation Note (Signed)
This note was copied from a baby's chart. ?Lactation Consultation Note ? ?Patient Name: Melissa Perkins ?Today's Date: 02/28/2022 ?Reason for consult: Initial assessment;Mother's request;Difficult latch;Primapara;1st time breastfeeding;Early term 37-38.6wks;Breastfeeding assistance ?Age:26 hours ? ?LC assisted with latching after offering colostrum on a spoon. Infant latched on off for 8 min. Mom pre pumping with manual pump size 21 flange before latching.  ?Parents to hand express and offer more.  ? ?Plan 1. To feed based on cues 8-12x 24hr period. Mom to offer breasts and look for signs of milk transfer.  ?2. Mom to hand express and offer colostrum on spoon 5- 7 ml per feeding.  ?3. I and O sheet reviewed.  ? ?All questions answered at the end of the visit.  ? ?Maternal Data ?Has patient been taught Hand Expression?: Yes ?Does the patient have breastfeeding experience prior to this delivery?: No ? ?Feeding ?Mother's Current Feeding Choice: Breast Milk ? ?LATCH Score ?Latch: Repeated attempts needed to sustain latch, nipple held in mouth throughout feeding, stimulation needed to elicit sucking reflex. ? ?Audible Swallowing: A few with stimulation ? ?Type of Nipple: Flat (will evert with stimulation and pre pumping) ? ?Comfort (Breast/Nipple): Soft / non-tender ? ?Hold (Positioning): Assistance needed to correctly position infant at breast and maintain latch. ? ?LATCH Score: 6 ? ? ?Lactation Tools Discussed/Used ?Tools: Pump;Flanges ?Flange Size: 21 ?Breast pump type: Manual ?Pump Education: Setup, frequency, and cleaning;Milk Storage ?Reason for Pumping: pre pump before latching ?Pumping frequency: 5-10 min each breast ? ?Interventions ?Interventions: Breast feeding basics reviewed;Assisted with latch;Skin to skin;Breast massage;Hand express;Breast compression;Adjust position;Support pillows;Position options;Expressed milk;Hand pump;Education;Infant Driven Feeding Algorithm education ? ?Discharge ?  ? ?Consult  Status ?Consult Status: Follow-up ?Date: 03/01/22 ?Follow-up type: In-patient ? ? ? ?Virgilia Quigg  Nicholson-Springer ?02/28/2022, 11:13 PM ? ? ? ?

## 2022-03-01 LAB — RAPID HIV SCREEN (HIV 1/2 AB+AG)
HIV 1/2 Antibodies: NONREACTIVE
HIV-1 P24 Antigen - HIV24: NONREACTIVE

## 2022-03-01 LAB — CBC
HCT: 29.8 % — ABNORMAL LOW (ref 36.0–46.0)
Hemoglobin: 9.6 g/dL — ABNORMAL LOW (ref 12.0–15.0)
MCH: 28.5 pg (ref 26.0–34.0)
MCHC: 32.2 g/dL (ref 30.0–36.0)
MCV: 88.4 fL (ref 80.0–100.0)
Platelets: 131 10*3/uL — ABNORMAL LOW (ref 150–400)
RBC: 3.37 MIL/uL — ABNORMAL LOW (ref 3.87–5.11)
RDW: 21.7 % — ABNORMAL HIGH (ref 11.5–15.5)
WBC: 16.3 10*3/uL — ABNORMAL HIGH (ref 4.0–10.5)
nRBC: 0 % (ref 0.0–0.2)

## 2022-03-01 LAB — RPR: RPR Ser Ql: NONREACTIVE

## 2022-03-01 NOTE — Lactation Note (Signed)
This note was copied from a Melissa's chart. ? ?NICU Lactation Consultation Note ? ?Patient Name: Melissa Perkins ?Today's Date: 03/01/2022 ?Age:26 hours ? ? ?Subjective ?Reason for consult: Follow-up assessment; 1st time breastfeeding; Primapara; NICU Melissa; Early term 37-38.6wks ? ?Lactation followed up with Melissa Perkins. Melissa Perkins has been transferred to NICU. I assisted with helping mom initiate pumping with a DEBP. She has not stimulated breasts since yesterday. I educated on flange fit, cleaning of supplies, pump settings, and what to expect with her milk volumes in days 1-3. I also re-taught HE, and we noted colostrum. Lots of education provided on breastfeeding basics. ? ?I recommended that she pump 8+ times a day. Melissa Perkins would also like to breastfeed. I encouraged her to put [facility] to the breast as soon as Melissa is medically stable, and allow him to be held STS and practice latching. Lactation to follow up. ? ?Objective ?Infant data: ?Mother's Current Feeding Choice: Breast Milk and Donor Milk ? ?Infant feeding assessment ?Scale for Readiness: 3 ? ?Maternal data: ?G1P1001  ?C-Section, Low Transverse ?Current breast feeding challenges:: NICU; separation ? ?Does the patient have breastfeeding experience prior to this delivery?: No ? ?Pumping frequency: rec q2-3 hours/day; q3-4 hours/night ?Pumped volume: 0 mL ?Flange Size: 24 ? ? ?Pump:  (has an insurance pump arriving on 5/9) ? ?Assessment ?Infant: ?LATCH Score: 4 ? ?Maternal: ?Milk volume: Normal ? ? ?Intervention/Plan ?Interventions: Breast feeding basics reviewed; Education; "The NICU and Your Melissa" book; DEBP ? ?Tools: Pump ?Pump Education: Setup, frequency, and cleaning ? ?Plan: ?Consult Status: Follow-up ? ?NICU Follow-up type: New admission follow up; Verify onset of copious milk; Verify absence of engorgement ? ? ? ?Melissa Perkins ?03/01/2022, 1:50 PM ?

## 2022-03-01 NOTE — Lactation Note (Signed)
This note was copied from a baby's chart. ?Lactation Consultation Note ?Attempted to latch but baby had no interest in BF. Noted baby slightly grunting then stopped. Noted increased shallow respirations. ?Placed baby STS on mom and notified RN. ?Hand expressed a few drops of colostrum w/gloved finger rubbed on babies gums. ?RN into assess baby. ?Encouraged mom to call back today for Lactation assistance when baby is more interested in BF. ? ?Patient Name: Melissa Perkins ?Today's Date: 03/01/2022 ?Reason for consult: Mother's request;Primapara;Early term 37-38.6wks ?Age:83 hours ? ?Maternal Data ?Has patient been taught Hand Expression?: Yes ?Does the patient have breastfeeding experience prior to this delivery?: No ? ?Feeding ?Mother's Current Feeding Choice: Breast Milk ? ?LATCH Score ?Latch: Too sleepy or reluctant, no latch achieved, no sucking elicited. ? ?Audible Swallowing: None ? ?Type of Nipple: Everted at rest and after stimulation ? ?Comfort (Breast/Nipple): Soft / non-tender ? ?Hold (Positioning): Full assist, staff holds infant at breast ? ?LATCH Score: 4 ? ? ?Lactation Tools Discussed/Used ?Tools: Pump;Flanges ?Flange Size: 21 ?Breast pump type: Manual ?Pump Education: Setup, frequency, and cleaning;Milk Storage ?Reason for Pumping: pre pump before latching ?Pumping frequency: 5-10 min each breast ? ?Interventions ?Interventions: Breast feeding basics reviewed;Assisted with latch;Skin to skin;Breast massage;Hand express;Pre-pump if needed;Breast compression;Adjust position;Support pillows;Position options;Expressed milk ? ?Discharge ?  ? ?Consult Status ?Consult Status: Follow-up ?Date: 03/01/22 ?Follow-up type: In-patient ? ? ? ?Charyl Dancer ?03/01/2022, 3:00 AM ? ? ? ?

## 2022-03-01 NOTE — Lactation Note (Signed)
This note was copied from a baby's chart. ?Lactation Consultation Note ? ?Patient Name: Melissa Perkins ?Today's Date: 03/01/2022 ?Reason for consult: Follow-up assessment;Mother's request;Primapara;1st time breastfeeding;NICU baby ?Age:26 hours ? ?Lactation returned to room to assist with breastfeeding. RN reported that baby [name] has been tachypneic.He was placed STS and gavage feeding initiated. I helped mom HE and showed her how to rub colostrum onto baby's gums. Deferred latch attempt at this time and reinforced pumping education. ? ?Maternal Data ?Has patient been taught Hand Expression?: Yes ?Does the patient have breastfeeding experience prior to this delivery?: No ? ?Feeding ?Mother's Current Feeding Choice: Breast Milk and Donor Milk ? ? ?Lactation Tools Discussed/Used ?Tools: Pump ?Flange Size: 24 ?Breast pump type: Double-Electric Breast Pump ?Pump Education: Setup, frequency, and cleaning ?Reason for Pumping: NICU ?Pumping frequency: rec q2-3 hours/day; q3-4 hours/night ?Pumped volume: 0 mL ? ?Interventions ?Interventions: Breast feeding basics reviewed;Education;"The NICU and Your Baby" book;DEBP ? ?Discharge ?Pump:  (has an insurance pump arriving on 5/9) ? ?Consult Status ?Consult Status: Follow-up ?Date: 03/01/22 ?Follow-up type: In-patient ? ? ? ?Walker Shadow ?03/01/2022, 3:22 PM ? ? ? ?

## 2022-03-01 NOTE — Progress Notes (Signed)
SVD: primary ? ?S:  Pt reports feeling well/ Tolerating po/ Voiding without problems/ No n/v/ Bleeding is light/ Pain controlled withprescription NSAID's including ibuprofen (Motrin) and narcotic analgesics including oxycodone/acetaminophen (Percocet, Tylox), Baby in [facility], for tachycardia.  ? ? ? ?O:  A & O x 3/ VS: Blood pressure 108/61, pulse 83, temperature 98.2 ?F (36.8 ?C), temperature source Oral, resp. rate 18, height 5\' 1"  (1.549 m), weight 95.5 kg, last menstrual period 06/09/2021, SpO2 99 %, unknown if currently breastfeeding. ? LABS:  ?Results for orders placed or performed during the hospital encounter of 02/28/22 (from the past 24 hour(s))  ?Type and screen [facility]     Status: None  ? Collection Time: 02/28/22 10:05 AM  ?Result Value Ref Range  ? ABO/RH(D) O POS   ? Antibody Screen NEG   ? Sample Expiration    ?  03/03/2022,2359 ?Performed at [facility], [address], [place], [place] ?  ?Fern Test     Status: Abnormal  ? Collection Time: 02/28/22 10:10 AM  ?Result Value Ref Range  ? POCT Fern Test Positive = ruptured amniotic membanes   ?CBC     Status: Abnormal  ? Collection Time: 02/28/22 10:30 AM  ?Result Value Ref Range  ? WBC 9.6 4.0 - 10.5 K/uL  ? RBC 4.14 3.87 - 5.11 MIL/uL  ? Hemoglobin 12.0 12.0 - 15.0 g/dL  ? HCT 36.1 36.0 - 46.0 %  ? MCV 87.2 80.0 - 100.0 fL  ? MCH 29.0 26.0 - 34.0 pg  ? MCHC 33.2 30.0 - 36.0 g/dL  ? RDW 22.2 (H) 11.5 - 15.5 %  ? Platelets 147 (L) 150 - 400 K/uL  ? nRBC 0.0 0.0 - 0.2 %  ?CBC     Status: Abnormal  ? Collection Time: 03/01/22  4:46 AM  ?Result Value Ref Range  ? WBC 16.3 (H) 4.0 - 10.5 K/uL  ? RBC 3.37 (L) 3.87 - 5.11 MIL/uL  ? Hemoglobin 9.6 (L) 12.0 - 15.0 g/dL  ? HCT 29.8 (L) 36.0 - 46.0 %  ? MCV 88.4 80.0 - 100.0 fL  ? MCH 28.5 26.0 - 34.0 pg  ? MCHC 32.2 30.0 - 36.0 g/dL  ? RDW 21.7 (H) 11.5 - 15.5 %  ? Platelets 131 (L) 150 - 400 K/uL  ? nRBC 0.0 0.0 - 0.2 %  ?Rapid HIV screen (HIV 1/2 Ab+Ag)     Status: None  ?  Collection Time: 03/01/22  4:46 AM  ?Result Value Ref Range  ? HIV-1 P24 Antigen - HIV24 NON REACTIVE NON REACTIVE  ? HIV 1/2 Antibodies NON REACTIVE NON REACTIVE  ? Interpretation (HIV Ag Ab)    ?  A non reactive test result means that HIV 1 or HIV 2 antibodies and HIV 1 p24 antigen were not detected in the specimen.  ? ? I&O: I/O last 3 completed shifts: ?In: 5551.8 [P.O.:2160; I.V.:3391.8] ?Out: 4136 [Urine:2875; Blood:1261] ?  No intake/output data recorded. ? Lungs: chest clear, no wheezing, rales, normal symmetric air entry ? Heart: regular rate and rhythm, S1, S2 normal, no murmur, click, rub or gallop ? Abdomen: soft uterus at umb firm. Primary dressing d/c/i ? Perineum: not inspected ? Lochia: light ? Extremities:no redness or tenderness in the calves or thighs, no edema ?  ? ?A/P: POD # 1/PPD # 1/ G1P1001 ?Gestational thrombocytopenia ? Doing well ? Continue routine post partum orders ?  ?  ?

## 2022-03-02 ENCOUNTER — Encounter (HOSPITAL_COMMUNITY): Payer: Self-pay | Admitting: Obstetrics and Gynecology

## 2022-03-02 NOTE — Progress Notes (Signed)
SVD: primary ? ? ? ?S:  Pt reports feeling well/ Tolerating po/ Voiding without problems/ No n/v/ Bleeding is moderate/ Pain controlled withprescription NSAID's including Motrin and narcotic analgesics including oxycodone/acetaminophen (Percocet, Tylox). Baby in [facility] ? ? ? ?O:  A & O x 3 / VS: Blood pressure 114/66, pulse (!) 102, temperature 98.2 ?F (36.8 ?C), temperature source Oral, resp. rate 18, height 5\' 1"  (1.549 m), weight 95.5 kg, last menstrual period 06/09/2021, SpO2 99 %, unknown if currently breastfeeding. ? LABS: No results found for this or any previous visit (from the past 24 hour(s)). ? I&O: I/O last 3 completed shifts: ?In: 3551.8 [P.O.:2160; I.V.:1391.8] ?Out: 2225 [Urine:2225] ?  No intake/output data recorded. ? Lungs: chest clear, no wheezing, rales, normal symmetric air entry ? Heart: regular rate and rhythm, S1, S2 normal, no murmur, click, rub or gallop ? Abdomen: soft uterus firm primary dressing c/d/i ? Perineum: not inspected ? Lochia: light ? Extremities:no redness or tenderness in the calves or thighs, no edema ?  ? ?A/P: POD # 2/PPD # 2/ 2226 ? Doing well ? Continue routine post partum orders ? Anticipate d/c in am ?  ?

## 2022-03-02 NOTE — Progress Notes (Signed)
Patient screened out for psychosocial assessment since none of the following apply:  Psychosocial stressors documented in mother or baby's chart  Gestation less than 32 weeks  Code at delivery   Infant with anomalies Please contact the Clinical Social Worker if specific needs arise, by MOB's request, or if MOB scores greater than 9/yes to question 10 on [place] Postpartum Depression Screen.  Doug Bucklin, LCSW Clinical Social Worker [facility] Cell#: [phone]     

## 2022-03-03 ENCOUNTER — Encounter: Payer: Federal, State, Local not specified - PPO | Admitting: Internal Medicine

## 2022-03-03 LAB — SURGICAL PATHOLOGY

## 2022-03-03 MED ORDER — OXYCODONE HCL 5 MG PO TABS: 5.0000 mg | ORAL_TABLET | Freq: Four times a day (QID) | ORAL | 0 refills | Status: AC | PRN

## 2022-03-03 MED ORDER — IBUPROFEN 600 MG PO TABS: 600.0000 mg | ORAL_TABLET | Freq: Four times a day (QID) | ORAL | 11 refills | Status: DC | PRN

## 2022-03-03 NOTE — Progress Notes (Signed)
SVD: primary ? ?S:  Pt reports feeling well/ Tolerating po/ Voiding without problems/ No n/v/ Bleeding is light/ Pain controlled withprescription NSAID's including ibuprofen (Motrin) and narcotic analgesics including oxycodone/acetaminophen (Percocet, Tylox) ? ? ? ?O:  A & O x 3 / VS: Blood pressure 105/75, pulse 92, temperature 97.8 ?F (36.6 ?C), temperature source Oral, resp. rate 18, height 5\' 1"  (1.549 m), weight 95.5 kg, last menstrual period 06/09/2021, SpO2 100 %, unknown if currently breastfeeding. ? LABS: No results found for this or any previous visit (from the past 24 hour(s)). ? I&O: No intake/output data recorded. ?  No intake/output data recorded. ? Lungs: chest clear, no wheezing, rales, normal symmetric air entry ? Heart: regular rate and rhythm, S1, S2 normal, no murmur, click, rub or gallop ? Abdomen: soft uterus firm @ umb ? Perineum: not inspected ? Lochia: light ? Extremities:no redness or tenderness in the calves or thighs, no edema ?  ? ?A/P: POD # 3/PPD # 3/ G1P1001 ? Doing well ? Continue routine post partum orders ? D/c instructions reviewed ?F/u 6 wk ?Staple removal office Monday ?  ?

## 2022-03-03 NOTE — Discharge Summary (Signed)
? ?  Postpartum Discharge Summary ? ?Date of Service updated ? ?   ?Patient Name: Melissa Perkins ?DOB: July 24, 1996 ?MRN: [id number] ? ?Date of admission: 02/28/2022 ?Delivery date:02/28/2022  ?Delivering provider: Mitzy Naron, Alanda Slim  ?Date of discharge: 03/03/2022 ? ?Admitting diagnosis: SROM, GBS cx positive ?Intrauterine pregnancy: [redacted]w[redacted]d    ?Secondary diagnosis:  fetal tachycardia ?Additional problems: maternal tachycardia    ?Discharge diagnosis: Preterm Pregnancy Delivered and GBS cx positive, gestational thrombocytopenia                                               ?Post partum procedures: none ?Augmentation: Pitocin ?Complications: None ? ?Hospital course: Onset of Labor With Unplanned C/S   ?26y.o. yo G1P1001 at 379w5das admitted in Latent Labor  with spontaneous rupture of membrane on 02/28/2022.  Patient had a labor course significant for unexplained fetal tachycardia with maternal tachycardia. GBS cx positive. IVF bolus given. Clindamycin prophylaxis given. Fetal tracing with accelerations noted and therefore pitocin augmentation started. Persistent fetal tachycardia despite intervention resulted on decision to proceed with C/S. The patient went for cesarean section due to  fetal tachycardia . Delivery details as follows: ?Membrane Rupture Time/Date: 7:30 AM ,02/28/2022   ?Delivery Method:C-Section, Low Transverse  ?Details of operation can be found in separate operative note. Patient had an uncomplicated postpartum course.  She is ambulating,tolerating a regular diet, passing flatus, and urinating well.  Patient is discharged home in stable condition 03/03/2022 ? ?Newborn Data: ?Birth date:02/28/20[birth year]  ?Birth time:3:57 PM  ?Gender:Female  ?Living status:Living  ?Apgars:8 ,8  ?Weight:3.19 kg  ? ?Magnesium Sulfate received: No ?BMZ received: No ?Rhophylac:No ?MMR:No ?T-DaP:Given prenatally ?Flu: No ?Transfusion:No ? ?Physical exam  ?Vitals:  ? 03/01/22 2033 03/02/22 0609 03/02/22 1955 03/03/22 0510  ?BP: (!) 113/56  114/66 107/67 105/75  ?Pulse: (!) 107 (!) 102 (!) 110 92  ?Resp: _0 ?Temp: 98.4 ?F (36.9 ?C) 98.2 ?F (36.8 ?C) (!) 97.4 ?F (36.3 ?C) 97.8 ?F (36.6 ?C)  ?TempSrc: Oral Oral Oral Oral  ?SpO2:   100% 100%  ?Weight:      ?Height:      ? ?General: alert, cooperative, and no distress ?Lochia: appropriate ?Uterine Fundus: firm ?Incision: Dressing is clean, dry, and intact ?DVT Evaluation: No evidence of DVT seen on physical exam. ?No cords or calf tenderness. ?Labs: ?Lab Results  ?Component Value Date  ? WBC 16.3 (H) 03/01/2022  ? HGB 9.6 (L) 03/01/2022  ? HCT 29.8 (L) 03/01/2022  ? MCV 88.4 03/01/2022  ? PLT 131 (L) 03/01/2022  ? ? ?  Latest Ref Rng & Units 04/24/2021  ?  3:17 PM  ?CMP  ?Glucose 70 - 99 mg/dL 87    ?BUN 6 - 23 mg/dL 6    ?Creatinine 0.40 - 1.20 mg/dL 0.79    ?Sodium 135 - 145 mEq/L 137    ?Potassium 3.5 - 5.1 mEq/L 3.8    ?Chloride 96 - 112 mEq/L 101    ?CO2 19 - 32 mEq/L 27    ?Calcium 8.4 - 10.5 mg/dL 9.8    ?Total Protein 6.0 - 8.3 g/dL 8.1    ?Total Bilirubin 0.2 - 1.2 mg/dL 0.8    ?Alkaline Phos 39 - 117 U/L 64    ?AST 0 - 37 U/L 20    ?ALT 0 - 35 U/L 12    ? ?  Edinburgh Score: ? ?  03/02/2022  ?  6:12 AM  ?Flavia Shipper Postnatal Depression Scale Screening Tool  ?I have been able to laugh and see the funny side of things. 0  ?I have looked forward with enjoyment to things. 0  ?I have blamed myself unnecessarily when things went wrong. 0  ?I have been anxious or worried for no good reason. 0  ?I have felt scared or panicky for no good reason. 0  ?Things have been getting on top of me. 0  ?I have been so unhappy that I have had difficulty sleeping. 0  ?I have felt sad or miserable. 0  ?I have been so unhappy that I have been crying. 0  ?The thought of harming myself has occurred to me. 0  ?Edinburgh Postnatal Depression Scale Total 0  ? ? ? ? ?After visit meds:  ?Allergies as of 03/03/2022   ? ?   Reactions  ? Amoxicillin Rash  ? ?  ? ?  ?Medication List  ?  ? ?TAKE these medications   ? ?ferrous  sulfate 325 (65 FE) MG tablet ?Take 325 mg by mouth daily with breakfast. ?  ?ibuprofen 600 MG tablet ?Commonly known as: ADVIL ?Take 1 tablet (600 mg total) by mouth every 6 (six) hours as needed. ?  ?oxyCODONE 5 MG immediate release tablet ?Commonly known as: Oxy IR/ROXICODONE ?Take 1 tablet (5 mg total) by mouth every 6 (six) hours as needed for up to 7 days for moderate pain or severe pain. ?  ?PNV Prenatal Plus Multivitamin 27-1 MG Tabs ?Take 1 tablet by mouth daily. ?  ? ?  ? ?  ?  ? ? ?  ?Discharge Care Instructions  ?(From admission, onward)  ?  ? ? ?  ? ?  Start     Ordered  ? 03/03/22 0000  Discharge wound care:       ?Comments: Staple removal in office on Monday  ? 03/03/22 0759  ? ?  ?  ? ?  ? ? ? ?Discharge home in stable condition ?Infant Feeding: Bottle and Breast ?Infant Disposition:NICU ?Discharge instruction: per After Visit Summary and Postpartum booklet. ?Activity: Advance as tolerated. Pelvic rest for 6 weeks.  ?Diet: iron rich diet ?Anticipated Birth Control: Condoms ?Postpartum Appointment:6 weeks ?Additional Postpartum F/U:  n/a ?Future Appointments: ?Future Appointments  ?Date Time Provider [place]  ?03/03/2022  8:50 AM Plotnikov, Evie Lacks, MD [facility] None  ? ?Follow up Visit: ? Follow-up Information   ? ? Servando Salina, MD Follow up.   ?Specialty: Obstetrics and Gynecology ?Why: call for appt for staple removal on Monday ?Contact information: ?[address] ?[place] [address] ?[phone] ? ? ?  ?  ? ?  ?  ? ?  ? ? ? ?  ? ?03/03/2022 ?Marvene Staff, MD ? ? ?

## 2022-03-03 NOTE — Discharge Instructions (Signed)
Call if temperature greater than equal to 100.4, nothing per vagina for 4-6 weeks or severe nausea vomiting, increased incisional pain , drainage or redness in the incision site, no straining with bowel movements, showers no bath °

## 2022-03-04 ENCOUNTER — Ambulatory Visit: Payer: Self-pay

## 2022-03-04 NOTE — Lactation Note (Signed)
This note was copied from a baby's chart. ? ?NICU Lactation Consultation Note ? ?Patient Name: Melissa Perkins ?Today's Date: 03/04/2022 ?Age:26 days ? ? ?Subjective ?Reason for consult: Follow-up assessment ?Mother has not pumped in past 12 hours. Infant is bottle feeding some but mother has not re-challenged at breast. She may have interest in bf'ing today and is aware of LC services in NICU prn. RN is aware of LC presence today to assist prn.  ? ?Mother denies +breast changes today. ? ?Objective ?Infant data: ?Mother's Current Feeding Choice: Breast Milk and Donor Milk ? ?Infant feeding assessment ?Scale for Readiness: 3 ?Scale for Quality: 4 ? ? ?  ?Maternal data: ?G1P1001  ?C-Section, Low Transverse ? ?Pumping frequency: 3-4 x day. No pumping in past 12 hours ?Pumped volume: 12 mL ? ?Risk factor for low milk supply:: infrequent pumping ? ? ?Pump:  (has an insurance pump arriving on 5/9) ? ?Assessment ?Maternal: ?Milk volume: Low ?Mother has a delay in onset of copious milk. The delay is likely because of infrequent milk removal in first 3 days.  ? ?Intervention/Plan ?Interventions: Education ?We discussed strategies to increase pumping, including apps that track pumping. ?Plan: ?Consult Status: Follow-up ? ?NICU Follow-up type: Weekly NICU follow up; Assist with IDF-2 (Mother does not need to pre-pump before breastfeeding); Verify onset of copious milk ? ?Mother to increase pumping to q3h. ?Mother to request bf assistance prn. ?Mother to consider a phone app to track pumping sessions. ? ?Elder Negus ?03/04/2022, 9:38 AM ?

## 2022-03-05 ENCOUNTER — Ambulatory Visit: Payer: Self-pay

## 2022-03-05 NOTE — Lactation Note (Signed)
This note was copied from a baby's chart. ? ?NICU Lactation Consultation Note ? ?Patient Name: Melissa Perkins ?Today's Date: 03/05/2022 ?Age:26 years ? ? ?Subjective ?Reason for consult: Follow-up assessment; Mother's request; Primapara; 1st time breastfeeding; NICU baby; Early term 37-38.6wks ? ?Lactation assisted Ms. Heathman with placing baby Katy Apo on the left breast, first in football hold, then in cradle hold. He was cueing with a pacifier upon entry. He showed rooting and interest in the breast but also appeared to be agitated. We did not establish latch. I placed a size 20 NS on breast and moved him to cradle hold. He grasped the shield with suckles, but they were disorganized, and baby continued to be agitated. I stopped attempt and moved him to mom's chest. ? ?I answered questions about her feeding plan. She would like lactation to return tomorrow for another attempt. I indicated that we would begin the attempt earlier to make sure he's calm. Lactation to follow up at 1430 on 5/12. ? ?We reviewed pumping routine, and I answered questions about breast milk storage and handling of breastmilk to bring to the NICU. ? ?Objective ?Infant data: ?Mother's Current Feeding Choice: Breast Milk and Donor Milk ? ?Infant feeding assessment ?Scale for Readiness: 1 ?Scale for Quality: 2 ? ?Maternal data: ?G1P1001  ?C-Section, Low Transverse ?Current breast feeding challenges:: NICU; establishing a latch ? ?Does the patient have breastfeeding experience prior to this delivery?: No ? ?Pumping frequency: q3 hours ?Pumped volume: 90 mL ? ?Risk factor for low milk supply:: infrequent pumping ? ? ?Pump: Personal (Aeroflow) ? ?Assessment ?Infant: ?LATCH Score: 6 ? ?No data recorded ? ?Maternal: ?Milk volume: Normal ? ? ?Intervention/Plan ?Interventions: Breast feeding basics reviewed; Assisted with latch; Hand express; Support pillows; Adjust position; Position options; Education ? ?Tools: Nipple [employer] Fuel ?Pump Education: Setup,  frequency, and cleaning ?Nipple shield size: 20 ? ?Plan: ?Consult Status: Follow-up ? ?NICU Follow-up type: Weekly NICU follow up ? ? ? ?Lenore Manner ?03/05/2022, 3:58 PM ?

## 2022-03-06 ENCOUNTER — Ambulatory Visit: Payer: Self-pay

## 2022-03-06 NOTE — Lactation Note (Signed)
This note was copied from a baby's chart. ? ?NICU Lactation Consultation Note ? ?Patient Name: Melissa Perkins ?Today's Date: 03/06/2022 ?Age:26 days ? ? ?Subjective ?Reason for consult: Follow-up assessment; Mother's request; Primapara; 1st time breastfeeding; NICU baby; Early term 37-38.6wks ? ?Lactation assisted with latching baby to the right breast in cross cradle to cradle hold. We used a size 20 NS. Baby was eager to latch, but was becoming frustrated. I provided some EBM via curved-tip syringe onto the NS and inside of it. When baby latched, I used the syringe to intermittently reinforce him to maintain his latch. After a few minutes, Melissa Perkins's milk let-down, and baby became rhythmic and organized at the breast. I observed him feed actively for 7-8+ minutes and then release the breast. He appeared sleepy, and we placed him upright on her chest. I heard audible swallows and noted that baby's mouth was wet and the NS had a puddle of milk residue. ? ?I encouraged Melissa Perkins to practice latching baby, as she wishes, to reinforce baby's skills. I encouraged her to seek some support from her RN to help with placing some EBM onto the NS.  I also encouraged Melissa Perkins to continue to pump after feedings to support a robust milk supply.  ? ? ?Objective ?Infant data: ?Mother's Current Feeding Choice: Breast Milk and Donor Milk ? ?Infant feeding assessment ?Scale for Readiness: 1 ?Scale for Quality: 5 (x2 choking during PO) ? ? ?Maternal data: ?G1P1001  ?C-Section, Low Transverse ? ?Current breast feeding challenges:: NICU ? ?Does the patient have breastfeeding experience prior to this delivery?: No ? ?Pumping frequency: rec post pumping q3 hours ?Pumped volume: 90 mL ? ? ?Pump: Personal (Aeroflow) ? ?Assessment ?Infant: ?LATCH Score: 9 ? ?No data recorded ? ?Maternal: ?Milk volume: Normal ? ? ?Intervention/Plan ?Interventions: Breast feeding basics reviewed; Assisted with latch; Breast compression; Adjust position;  Support pillows; Education ? ?Tools: Nipple [employer] Fuel ?Pump Education: Setup, frequency, and cleaning ?Nipple shield size: 20 ? ?Plan: ?Consult Status: Follow-up ? ?NICU Follow-up type: Weekly NICU follow up ? ? ? ?Lenore Manner ?03/06/2022, 4:07 PM ?

## 2022-03-09 ENCOUNTER — Telehealth (HOSPITAL_COMMUNITY): Payer: Self-pay

## 2022-03-09 NOTE — Telephone Encounter (Signed)
No answer. Left message to return nurse call. ? Heron Nay ?05/15//2023,1546 ?

## 2022-03-16 ENCOUNTER — Telehealth (HOSPITAL_COMMUNITY): Payer: Self-pay

## 2022-03-16 NOTE — Telephone Encounter (Signed)
Patient returning nurse phone call.  "I'm doing fine. Adapting fine, feeling fine." Patient has no questions or concerns about her healing.  "He's eating a lot, doing well. He has some drainage from his cord. Is that normal? It's a yellow color and no foul odor." RN reviewed cord care with patient and symptoms of infection to report to the pediatrician. RN told patient to call her pediatrician and discuss drainage from cord. "He sleeps in a bassinet." RN reviewed ABC's of safe sleep with patient. Patient declines any questions or concerns about baby.  EPDS score is 0.  Marcelino Duster [facility] 05/22//2023,1424

## 2022-06-29 ENCOUNTER — Other Ambulatory Visit: Payer: Self-pay

## 2022-06-29 ENCOUNTER — Emergency Department: Payer: Managed Care, Other (non HMO)

## 2022-06-29 ENCOUNTER — Inpatient Hospital Stay
Admission: EM | Admit: 2022-06-29 | Discharge: 2022-07-01 | DRG: 769 | Disposition: A | Discharge status: Home / Self Care | Payer: Managed Care, Other (non HMO) | Attending: Internal Medicine | Admitting: Internal Medicine

## 2022-06-29 ENCOUNTER — Inpatient Hospital Stay (HOSPITAL_COMMUNITY)
Admission: AD | Admit: 2022-06-29 | Discharge: 2022-06-29 | Disposition: A | Discharge status: Home / Self Care | Payer: Managed Care, Other (non HMO) | Attending: Obstetrics and Gynecology | Admitting: Obstetrics and Gynecology

## 2022-06-29 DIAGNOSIS — Z800 Family history of malignant neoplasm of digestive organs: Secondary | ICD-10-CM

## 2022-06-29 DIAGNOSIS — O9113 Abscess of breast associated with lactation: Secondary | ICD-10-CM | POA: Diagnosis not present

## 2022-06-29 DIAGNOSIS — Z8613 Personal history of malaria: Secondary | ICD-10-CM

## 2022-06-29 DIAGNOSIS — N61 Mastitis without abscess: Principal | ICD-10-CM

## 2022-06-29 DIAGNOSIS — N611 Abscess of the breast and nipple: Secondary | ICD-10-CM

## 2022-06-29 DIAGNOSIS — Z9189 Other specified personal risk factors, not elsewhere classified: Secondary | ICD-10-CM

## 2022-06-29 DIAGNOSIS — Z803 Family history of malignant neoplasm of breast: Secondary | ICD-10-CM | POA: Diagnosis not present

## 2022-06-29 DIAGNOSIS — N644 Mastodynia: Secondary | ICD-10-CM | POA: Diagnosis not present

## 2022-06-29 LAB — CBC WITH DIFFERENTIAL/PLATELET
Abs Immature Granulocytes: 0.04 10*3/uL (ref 0.00–0.07)
Basophils Absolute: 0.1 10*3/uL (ref 0.0–0.1)
Basophils Relative: 0 %
Eosinophils Absolute: 0.1 10*3/uL (ref 0.0–0.5)
Eosinophils Relative: 1 %
HCT: 39 % (ref 36.0–46.0)
Hemoglobin: 12.4 g/dL (ref 12.0–15.0)
Immature Granulocytes: 0 %
Lymphocytes Relative: 13 %
Lymphs Abs: 1.8 10*3/uL (ref 0.7–4.0)
MCH: 26.1 pg (ref 26.0–34.0)
MCHC: 31.8 g/dL (ref 30.0–36.0)
MCV: 82.1 fL (ref 80.0–100.0)
Monocytes Absolute: 1.1 10*3/uL — ABNORMAL HIGH (ref 0.1–1.0)
Monocytes Relative: 8 %
Neutro Abs: 10.6 10*3/uL — ABNORMAL HIGH (ref 1.7–7.7)
Neutrophils Relative %: 78 %
Platelets: 283 10*3/uL (ref 150–400)
RBC: 4.75 MIL/uL (ref 3.87–5.11)
RDW: 17.4 % — ABNORMAL HIGH (ref 11.5–15.5)
WBC: 13.7 10*3/uL — ABNORMAL HIGH (ref 4.0–10.5)
nRBC: 0 % (ref 0.0–0.2)

## 2022-06-29 LAB — COMPREHENSIVE METABOLIC PANEL
ALT: 25 U/L (ref 0–44)
AST: 30 U/L (ref 15–41)
Albumin: 4.2 g/dL (ref 3.5–5.0)
Alkaline Phosphatase: 92 U/L (ref 38–126)
Anion gap: 8 (ref 5–15)
BUN: 12 mg/dL (ref 6–20)
CO2: 25 mmol/L (ref 22–32)
Calcium: 9.3 mg/dL (ref 8.9–10.3)
Chloride: 106 mmol/L (ref 98–111)
Creatinine, Ser: 0.75 mg/dL (ref 0.44–1.00)
GFR, Estimated: >60 mL/min (ref >60)
Glucose, Bld: 94 mg/dL (ref 70–99)
Potassium: 4 mmol/L (ref 3.5–5.1)
Sodium: 139 mmol/L (ref 135–145)
Total Bilirubin: 1.2 mg/dL (ref 0.3–1.2)
Total Protein: 8.7 g/dL — ABNORMAL HIGH (ref 6.5–8.1)

## 2022-06-29 MED ORDER — SODIUM CHLORIDE 0.9 % IV SOLN
1.0000 g | INTRAVENOUS | Status: DC
  Administered 2022-06-30: 1 g via INTRAVENOUS
  Filled 2022-06-29 (×2): qty 10

## 2022-06-29 MED ORDER — VANCOMYCIN HCL IN DEXTROSE 1-5 GM/200ML-% IV SOLN: 1000.0000 mg | Freq: Once | INTRAVENOUS | Status: DC

## 2022-06-29 MED ORDER — ACETAMINOPHEN 650 MG RE SUPP: 650.0000 mg | Freq: Four times a day (QID) | RECTAL | Status: DC | PRN

## 2022-06-29 MED ORDER — ONDANSETRON HCL 4 MG PO TABS: 4.0000 mg | ORAL_TABLET | Freq: Four times a day (QID) | ORAL | Status: DC | PRN

## 2022-06-29 MED ORDER — MORPHINE SULFATE (PF) 2 MG/ML IV SOLN
2.0000 mg | INTRAVENOUS | Status: DC | PRN
  Administered 2022-07-01: 2 mg via INTRAVENOUS
  Filled 2022-06-29: qty 1

## 2022-06-29 MED ORDER — SODIUM CHLORIDE 0.9 % IV SOLN
1.0000 g | Freq: Once | INTRAVENOUS | Status: AC
  Administered 2022-06-29: 1 g via INTRAVENOUS
  Filled 2022-06-29: qty 10

## 2022-06-29 MED ORDER — ONDANSETRON HCL 4 MG/2ML IJ SOLN: 4.0000 mg | Freq: Four times a day (QID) | INTRAMUSCULAR | Status: DC | PRN

## 2022-06-29 MED ORDER — ACETAMINOPHEN 325 MG PO TABS
650.0000 mg | ORAL_TABLET | Freq: Four times a day (QID) | ORAL | Status: DC | PRN
  Administered 2022-06-30 – 2022-07-01 (×4): 650 mg via ORAL
  Filled 2022-06-29 (×4): qty 2

## 2022-06-29 MED ORDER — KETOROLAC TROMETHAMINE 30 MG/ML IJ SOLN
30.0000 mg | Freq: Four times a day (QID) | INTRAMUSCULAR | Status: DC | PRN
  Administered 2022-06-30 – 2022-07-01 (×4): 30 mg via INTRAVENOUS
  Filled 2022-06-29 (×4): qty 1

## 2022-06-29 MED ORDER — ACETAMINOPHEN 325 MG PO TABS
650.0000 mg | ORAL_TABLET | Freq: Once | ORAL | Status: AC
  Administered 2022-06-29: 650 mg via ORAL
  Filled 2022-06-29: qty 2

## 2022-06-29 MED ORDER — HYDROCODONE-ACETAMINOPHEN 5-325 MG PO TABS: 1.0000 | ORAL_TABLET | ORAL | Status: DC | PRN

## 2022-06-29 MED ORDER — VANCOMYCIN HCL IN DEXTROSE 1-5 GM/200ML-% IV SOLN
1000.0000 mg | Freq: Once | INTRAVENOUS | Status: DC
  Filled 2022-06-29: qty 200

## 2022-06-29 NOTE — MAU Note (Signed)
Melissa Perkins is a 27 y.o. here in MAU reporting: clogged milk duct since last Thursday and now thinks there might be an abscess. States area is very painful. No fevers.   Onset of complaint: ongoing  Pain score: 9/10  Vitals:   06/29/22 1824  BP: 129/81  Pulse: (!) 108  Resp: 16  Temp: 98.2 F (36.8 C)  SpO2: 100%     Lab orders placed from triage: none

## 2022-06-29 NOTE — MAU Note (Signed)
26 yo G1P1 BF s/p vaginal delivery  02/28/2022 presents with c/o clogged duct ? Abscess since Thursday. Denies fever . Pain at site; has tried several OTC items w/o success  VS/BP 129/81 (BP Location: Right Arm)   Pulse (!) 108   Temp 98.2 F (36.8 C) (Oral)   Resp 16   Wt 88.2 kg   SpO2 100% Comment: room air  Breastfeeding Yes   BMI 36.73 kg/m   Advised need to be seen in regular ER for further mgmt IMP Breast Problem P) d/c . Pt plans to go [place]  [facility]

## 2022-06-29 NOTE — Progress Notes (Signed)
PHARMACY -  BRIEF ANTIBIOTIC NOTE   Pharmacy has received consult(s) for Vancomycin from an ED provider.  The patient's profile has been reviewed for ht/wt/allergies/indication/available labs.    One time order(s) placed for Vancomycin 2 gm per pt wt: 88.5 kg and Ob/Gyn status unknown at this time.  Further antibiotics/pharmacy consults should be ordered by admitting physician if indicated.                       Thank you, Otelia Sergeant, PharmD, [facility] 06/29/2022 11:39 PM

## 2022-06-29 NOTE — MAU Note (Signed)
Dr Cherly Hensen notified of patient arrival

## 2022-06-29 NOTE — ED Provider Notes (Signed)
[facility] Provider Note    Event Date/Time   First MD Initiated Contact with Patient 06/29/22 2023     (approximate)   History   Breast Problem   HPI  Melissa Perkins is a 26 y.o. female recent postpartum currently breast-feeding presents to the ER for painful right breast since last Thursday.  Initially thought it was clogged milk duct but has had worsening pain redness and started having a firm area about the size of a golf ball above her right nipple.  No measured fevers.  No recent antibiotics.     Physical Exam   Triage Vital Signs: ED Triage Vitals  Enc Vitals Group     BP 06/29/22 1959 135/89     Pulse Rate 06/29/22 1959 98     Resp 06/29/22 1959 18     Temp 06/29/22 1959 98.3 F (36.8 C)     Temp Source 06/29/22 1959 Oral     SpO2 06/29/22 1959 97 %     Weight 06/29/22 2002 195 lb (88.5 kg)     Height 06/29/22 2002 5\' 1"  (1.549 m)     Head Circumference --      Peak Flow --      Pain Score 06/29/22 2002 10     Pain Loc --      Pain Edu? --      Excl. in GC? --     Most recent vital signs: Vitals:   06/29/22 1959  BP: 135/89  Pulse: 98  Resp: 18  Temp: 98.3 F (36.8 C)  SpO2: 97%     Constitutional: Alert  Eyes: Conjunctivae are normal.  Head: Atraumatic. Nose: No congestion/rhinnorhea. Mouth/Throat: Mucous membranes are moist.   Neck: Painless ROM.  Cardiovascular:   Good peripheral circulation. Respiratory: Normal respiratory effort.  No retractions.  Gastrointestinal: Soft and nontender.  Musculoskeletal:  no deformity Neurologic:  MAE spontaneously. No gross focal neurologic deficits are appreciated.  Skin:  Skin is warm, dry and intact. No rash noted. Psychiatric: Mood and affect are normal. Speech and behavior are normal.    ED Results / Procedures / Treatments   Labs (all labs ordered are listed, but only abnormal results are displayed) Labs Reviewed  CBC WITH DIFFERENTIAL/PLATELET - Abnormal; Notable  for the following components:      Result Value   WBC 13.7 (*)    RDW 17.4 (*)    Neutro Abs 10.6 (*)    Monocytes Absolute 1.1 (*)    All other components within normal limits  COMPREHENSIVE METABOLIC PANEL - Abnormal; Notable for the following components:   Total Protein 8.7 (*)    All other components within normal limits     EKG     RADIOLOGY Please see ED Course for my review and interpretation.  I personally reviewed all radiographic images ordered to evaluate for the above acute complaints and reviewed radiology reports and findings.  These findings were personally discussed with the patient.  Please see medical record for radiology report.    PROCEDURES:  Critical Care performed: No  Procedures   MEDICATIONS ORDERED IN ED: Medications  cefTRIAXone (ROCEPHIN) 1 g in sodium chloride 0.9 % 100 mL IVPB (1 g Intravenous New Bag/Given 06/29/22 2355)  vancomycin (VANCOCIN) IVPB 1000 mg/200 mL premix (has no administration in time range)    Followed by  vancomycin (VANCOCIN) IVPB 1000 mg/200 mL premix (has no administration in time range)  acetaminophen (TYLENOL) tablet 650 mg (has no administration  in time range)    Or  acetaminophen (TYLENOL) suppository 650 mg (has no administration in time range)  ondansetron (ZOFRAN) tablet 4 mg (has no administration in time range)    Or  ondansetron (ZOFRAN) injection 4 mg (has no administration in time range)  cefTRIAXone (ROCEPHIN) 1 g in sodium chloride 0.9 % 100 mL IVPB (has no administration in time range)  HYDROcodone-acetaminophen (NORCO/VICODIN) 5-325 MG per tablet 1-2 tablet (has no administration in time range)  ketorolac (TORADOL) 30 MG/ML injection 30 mg (has no administration in time range)  morphine (PF) 2 MG/ML injection 2 mg (has no administration in time range)  acetaminophen (TYLENOL) tablet 650 mg (650 mg Oral Given 06/29/22 2112)     IMPRESSION / MDM / ASSESSMENT AND PLAN / ED COURSE  I reviewed the triage  vital signs and the nursing notes.                              Differential diagnosis includes, but is not limited to, abscess, clogged duct, cellulitis, mastitis, mass  Patient presented to the ER for evaluation of symptoms as described above.  Findings concerning for significant mastitis and probable abscess versus clogged duct.  Lower suspicion for mass.  Ultrasound we ordered for the but differential.  Does have mild leukocytosis she is not toxic appearing but is quite uncomfortable.  Have recommended pain medication patient declining any narcotic medication as she is breast-feeding.   Clinical Course as of 06/30/22 0002  Mon Jun 29, 2022  2245 Ultrasound breast on my review and interpretation concerning for abscess. [PR]  Tue Jun 30, 2022  0001 Ultrasound with phlegmon and possible microabscesses I discussed case in consultation with Dr. Everlene Farrier of general surgery was recommended admission for IV antibiotics as well as consult with IR for needle aspiration drainage.  I have consulted hospitalist for admission.  Patient and family agreeable to plan.  Have ordered IV Rocephin as well as vancomycin. [PR]    Clinical Course User Index [PR] Willy Eddy, MD     FINAL CLINICAL IMPRESSION(S) / ED DIAGNOSES   Final diagnoses:  Mastitis  Breast abscess     Rx / DC Orders   ED Discharge Orders     None        Note:  This document was prepared using Dragon voice recognition software and may include unintentional dictation errors.    Willy Eddy, MD 06/30/22 Marlyne Beards

## 2022-06-29 NOTE — ED Triage Notes (Signed)
Clogged milk duct to right breast since Thursday. Pt has had slight drainage and redness to area.  Area of edema and redness noted above right nipple.

## 2022-06-29 NOTE — ED Notes (Signed)
US at bedside

## 2022-06-30 ENCOUNTER — Encounter: Payer: Self-pay | Admitting: Internal Medicine

## 2022-06-30 ENCOUNTER — Encounter
Admission: EM | Disposition: A | Discharge status: Home / Self Care | Payer: Self-pay | Source: Home / Self Care | Attending: Family Medicine

## 2022-06-30 ENCOUNTER — Inpatient Hospital Stay: Payer: Managed Care, Other (non HMO) | Admitting: Anesthesiology

## 2022-06-30 DIAGNOSIS — O9113 Abscess of breast associated with lactation: Secondary | ICD-10-CM | POA: Diagnosis not present

## 2022-06-30 DIAGNOSIS — N611 Abscess of the breast and nipple: Secondary | ICD-10-CM

## 2022-06-30 HISTORY — PX: INCISION AND DRAINAGE ABSCESS: SHX5864

## 2022-06-30 LAB — PREGNANCY, URINE: Preg Test, Ur: NEGATIVE

## 2022-06-30 SURGERY — INCISION AND DRAINAGE, ABSCESS
Anesthesia: General | Site: Breast | Laterality: Right

## 2022-06-30 MED ORDER — PROPOFOL 10 MG/ML IV BOLUS
INTRAVENOUS | Status: DC | PRN
  Administered 2022-06-30: 170 mg via INTRAVENOUS

## 2022-06-30 MED ORDER — PROPOFOL 10 MG/ML IV BOLUS
INTRAVENOUS | Status: AC
  Filled 2022-06-30: qty 20

## 2022-06-30 MED ORDER — 0.9 % SODIUM CHLORIDE (POUR BTL) OPTIME
TOPICAL | Status: DC | PRN
  Administered 2022-06-30: 200 mL

## 2022-06-30 MED ORDER — DEXAMETHASONE SODIUM PHOSPHATE 10 MG/ML IJ SOLN
INTRAMUSCULAR | Status: DC | PRN
  Administered 2022-06-30: 12 mg via INTRAVENOUS
  Administered 2022-06-30: 8 mg via INTRAVENOUS

## 2022-06-30 MED ORDER — FENTANYL CITRATE (PF) 100 MCG/2ML IJ SOLN
INTRAMUSCULAR | Status: AC
  Filled 2022-06-30: qty 2

## 2022-06-30 MED ORDER — MIDAZOLAM HCL 2 MG/2ML IJ SOLN
INTRAMUSCULAR | Status: DC | PRN
  Administered 2022-06-30: 2 mg via INTRAVENOUS

## 2022-06-30 MED ORDER — VANCOMYCIN HCL 2000 MG/400ML IV SOLN
2000.0000 mg | Freq: Once | INTRAVENOUS | Status: AC
  Administered 2022-06-30: 2000 mg via INTRAVENOUS
  Filled 2022-06-30: qty 400

## 2022-06-30 MED ORDER — DIPHENHYDRAMINE HCL 50 MG/ML IJ SOLN
25.0000 mg | Freq: Once | INTRAMUSCULAR | Status: AC
  Administered 2022-06-30: 25 mg via INTRAVENOUS
  Filled 2022-06-30: qty 1

## 2022-06-30 MED ORDER — DEXMEDETOMIDINE (PRECEDEX) IN NS 20 MCG/5ML (4 MCG/ML) IV SYRINGE
PREFILLED_SYRINGE | INTRAVENOUS | Status: DC | PRN
  Administered 2022-06-30: 8 ug via INTRAVENOUS

## 2022-06-30 MED ORDER — ONDANSETRON HCL 4 MG/2ML IJ SOLN
INTRAMUSCULAR | Status: DC | PRN
  Administered 2022-06-30: 4 mg via INTRAVENOUS

## 2022-06-30 MED ORDER — SODIUM CHLORIDE 0.9 % IV SOLN: INTRAVENOUS | Status: DC | PRN

## 2022-06-30 MED ORDER — MIDAZOLAM HCL 2 MG/2ML IJ SOLN
INTRAMUSCULAR | Status: AC
  Filled 2022-06-30: qty 2

## 2022-06-30 MED ORDER — BUPIVACAINE LIPOSOME 1.3 % IJ SUSP
INTRAMUSCULAR | Status: AC
  Filled 2022-06-30: qty 20

## 2022-06-30 MED ORDER — OXYCODONE HCL 5 MG/5ML PO SOLN: 5.0000 mg | Freq: Once | ORAL | Status: DC | PRN

## 2022-06-30 MED ORDER — BUPIVACAINE-EPINEPHRINE (PF) 0.5% -1:200000 IJ SOLN
INTRAMUSCULAR | Status: AC
  Filled 2022-06-30: qty 30

## 2022-06-30 MED ORDER — LIDOCAINE HCL (CARDIAC) PF 100 MG/5ML IV SOSY
PREFILLED_SYRINGE | INTRAVENOUS | Status: DC | PRN
  Administered 2022-06-30: 100 mg via INTRAVENOUS

## 2022-06-30 MED ORDER — LACTATED RINGERS IV SOLN: INTRAVENOUS | Status: DC | PRN

## 2022-06-30 MED ORDER — DIPHENHYDRAMINE HCL 25 MG PO CAPS
50.0000 mg | ORAL_CAPSULE | Freq: Once | ORAL | Status: AC
Start: 2022-06-30 — End: 2022-06-30
  Administered 2022-06-30: 50 mg via ORAL
  Filled 2022-06-30: qty 2

## 2022-06-30 MED ORDER — FENTANYL CITRATE (PF) 100 MCG/2ML IJ SOLN: 25.0000 ug | INTRAMUSCULAR | Status: DC | PRN

## 2022-06-30 MED ORDER — BUPIVACAINE-EPINEPHRINE (PF) 0.25% -1:200000 IJ SOLN
INTRAMUSCULAR | Status: AC
  Filled 2022-06-30: qty 30

## 2022-06-30 MED ORDER — FENTANYL CITRATE (PF) 100 MCG/2ML IJ SOLN
INTRAMUSCULAR | Status: DC | PRN
  Administered 2022-06-30: 50 ug via INTRAVENOUS
  Administered 2022-06-30: 100 ug via INTRAVENOUS
  Administered 2022-06-30: 50 ug via INTRAVENOUS

## 2022-06-30 MED ORDER — DIPHENHYDRAMINE HCL 50 MG/ML IJ SOLN
12.5000 mg | Freq: Once | INTRAMUSCULAR | Status: AC
  Administered 2022-06-30: 12.5 mg via INTRAVENOUS
  Filled 2022-06-30: qty 1

## 2022-06-30 MED ORDER — VANCOMYCIN HCL IN DEXTROSE 1-5 GM/200ML-% IV SOLN
1000.0000 mg | Freq: Two times a day (BID) | INTRAVENOUS | Status: DC
  Administered 2022-06-30 – 2022-07-01 (×3): 1000 mg via INTRAVENOUS
  Filled 2022-06-30 (×3): qty 200

## 2022-06-30 MED ORDER — OXYCODONE HCL 5 MG PO TABS: 5.0000 mg | ORAL_TABLET | Freq: Once | ORAL | Status: DC | PRN

## 2022-06-30 SURGICAL SUPPLY — 26 items
BLADE CLIPPER SURG (BLADE) ×1 IMPLANT
BLADE SURG 15 STRL LF DISP TIS (BLADE) ×1 IMPLANT
BLADE SURG 15 STRL SS (BLADE)
BNDG CMPR 82X61 PLY HI ABS (GAUZE/BANDAGES/DRESSINGS) ×1
BNDG CONFORM 6X.82 1P STRL (GAUZE/BANDAGES/DRESSINGS) IMPLANT
BRUSH SCRUB EZ  4% CHG (MISCELLANEOUS)
BRUSH SCRUB EZ 4% CHG (MISCELLANEOUS) ×1 IMPLANT
DRAPE LAPAROTOMY 77X122 PED (DRAPES) ×1 IMPLANT
ELECT CAUTERY BLADE 6.4 (BLADE) IMPLANT
ELECT REM PT RETURN 9FT ADLT (ELECTROSURGICAL) ×1
ELECTRODE REM PT RTRN 9FT ADLT (ELECTROSURGICAL) ×1 IMPLANT
GAUZE 4X4 16PLY ~~LOC~~+RFID DBL (SPONGE) ×1 IMPLANT
GAUZE SPONGE 4X4 12PLY STRL (GAUZE/BANDAGES/DRESSINGS) IMPLANT
GLOVE BIO SURGEON STRL SZ7 (GLOVE) ×1 IMPLANT
GOWN STRL REUS W/ TWL LRG LVL3 (GOWN DISPOSABLE) ×2 IMPLANT
GOWN STRL REUS W/TWL LRG LVL3 (GOWN DISPOSABLE) ×3
MANIFOLD NEPTUNE II (INSTRUMENTS) ×1 IMPLANT
NEEDLE HYPO 22GX1.5 SAFETY (NEEDLE) ×1 IMPLANT
NS IRRIG 1000ML POUR BTL (IV SOLUTION) ×1 IMPLANT
PACK BASIN MINOR ARMC (MISCELLANEOUS) ×1 IMPLANT
SOL PREP PVP 2OZ (MISCELLANEOUS) ×1
SOLUTION PREP PVP 2OZ (MISCELLANEOUS) ×1 IMPLANT
SPONGE T-LAP 18X18 ~~LOC~~+RFID (SPONGE) ×1 IMPLANT
SWAB CULTURE AMIES ANAERIB BLU (MISCELLANEOUS) IMPLANT
TRAP FLUID SMOKE EVACUATOR (MISCELLANEOUS) ×1 IMPLANT
WATER STERILE IRR 500ML POUR (IV SOLUTION) ×1 IMPLANT

## 2022-06-30 NOTE — Consult Note (Signed)
Patient ID: Melissa Perkins, female   DOB: 02-01-1996, 26 y.o.   MRN: [id number]  HPI Melissa Perkins is a 26 y.o. female in consultation at the request of Dr. Para March .  She reports 4 to 5-day history of right breast pain.  The pain is severe , sharp persistent and worsening with pressure and breast-feeding. We have a C-section back in May.  She also had ultrasound that have personally reviewed showing evidence of a complex collection in the right breast. CBC shows a white count of 13,000 with a left shift.  MP is completely normal.  She is able to perform more than 4 METS of activity without any shortness of breath or chest pain. Does have a history of breast cancer on maternal grandmother  HPI  Past Medical History:  Diagnosis Date   Malaria    Migraines    without aura    Past Surgical History:  Procedure Laterality Date   CESAREAN SECTION N/A 02/28/2022   Procedure: CESAREAN SECTION;  Surgeon: Maxie Better, MD;  Location: [facility] LD [facility]S;  Service: Obstetrics;  Laterality: N/A;   WISDOM TOOTH EXTRACTION      Family History  Problem Relation Age of Onset   Hypertension Father    Breast cancer Maternal Grandmother    Prostate cancer Maternal Grandfather    Colon cancer Maternal Grandfather    Heart attack Paternal Grandmother    Obesity Paternal Grandmother    Stroke Paternal Grandfather    Vascular Disease Paternal Grandfather     Social History Social History   Tobacco Use   Smoking status: Never   Smokeless tobacco: Never  Vaping Use   Vaping Use: Never used  Substance Use Topics   Alcohol use: Not Currently    Comment: occ   Drug use: Never    Allergies  Allergen Reactions   Amoxicillin Rash    Current Facility-Administered Medications  Medication Dose Route Frequency Provider Last Rate Last Admin   acetaminophen (TYLENOL) tablet 650 mg  650 mg Oral Q6H PRN Andris Baumann, MD   650 mg at 06/30/22 0800   Or   acetaminophen (TYLENOL) suppository 650 mg   650 mg Rectal Q6H PRN Andris Baumann, MD       cefTRIAXone (ROCEPHIN) 1 g in sodium chloride 0.9 % 100 mL IVPB  1 g Intravenous Q24H Lindajo Royal V, MD       diphenhydrAMINE (BENADRYL) capsule 50 mg  50 mg Oral Once Foust, Katy L, NP       HYDROcodone-acetaminophen (NORCO/VICODIN) 5-325 MG per tablet 1-2 tablet  1-2 tablet Oral Q4H PRN Andris Baumann, MD       ketorolac (TORADOL) 30 MG/ML injection 30 mg  30 mg Intravenous Q6H PRN Lindajo Royal V, MD   30 mg at 06/30/22 0425   morphine (PF) 2 MG/ML injection 2 mg  2 mg Intravenous Q2H PRN Andris Baumann, MD       ondansetron [facility]) tablet 4 mg  4 mg Oral Q6H PRN Andris Baumann, MD       Or   ondansetron [facility]) injection 4 mg  4 mg Intravenous Q6H PRN Andris Baumann, MD       vancomycin (VANCOCIN) IVPB 1000 mg/200 mL premix  1,000 mg Intravenous Q12H Otelia Sergeant, RPH         Review of Systems Full ROS  was asked and was negative except for the information on the HPI  Physical Exam Blood pressure  137/82, pulse 100, temperature 98.3 F (36.8 C), temperature source Oral, resp. rate 17, height 5\' 1"  (1.549 m), weight 88.5 kg, SpO2 100 %, currently breastfeeding. CONSTITUTIONAL: NAD. EYES: Pupils are equal, round,  Sclera are non-icteric. EARS, NOSE, MOUTH AND THROAT: The oral mucosa is pink and moist. Hearing is intact to voice. LYMPH NODES:  Lymph nodes in the neck are normal. RESPIRATORY:  Lungs are clear. There is normal respiratory effort, with equal breath sounds bilaterally, and without pathologic use of accessory muscles. CARDIOVASCULAR: Heart is regular without murmurs, gallops, or rubs. BREAST: Evidence of an obvious abscess located 12:00 in the right breast there is fluctuance and exquisite tenderness to palpation.  There is no evidence of necrotizing infection.  There is no evidence of clear masses but exam is limited due to severe pain.  Left breast without any palpable abnormalities.  Axilla is without any evidence of  lymphadenopathy GI: The abdomen is  soft, nontender, and nondistended. There are no palpable masses. There is no hepatosplenomegaly. There are normal bowel sounds in all quadrants. GU: Rectal deferred.   MUSCULOSKELETAL: Normal muscle strength and tone. No cyanosis or edema.   SKIN: Turgor is good and there are no pathologic skin lesions or ulcers. NEUROLOGIC: Motor and sensation is grossly normal. Cranial nerves are grossly intact. PSYCH:  Oriented to person, place and time. Affect is normal.  Data Reviewed  I have personally reviewed the patient's imaging, laboratory findings and medical records.    Assessment/Plan  26 year old female lactating with right breast absces .  Do think that this abscess merits formal I&D.  Do not think aspiration will be enough at this is a large abscess and I do not think that the patient will tolerate any bedside procedures.  I had an extensive discussion with the patient regarding her disease process. I discussed with the patient the procedure in detail.  Risks, benefits and possible complications including but not limited to: Bleeding, infection injury to adjacent structures, long wound healing and pain.  I definitely think that this is the best course of treatment. I Spent 75 minutes in this encounter including personally reviewing imaging studies, coordinating her care, placing orders, counseling the patient and performing appropriate documentation  22, MD FACS General Surgeon 06/30/2022, 8:04 AM

## 2022-06-30 NOTE — Anesthesia Postprocedure Evaluation (Signed)
Anesthesia Post Note  Patient: MIGDALIA OLEJNICZAK  Procedure(s) Performed: INCISION AND DRAINAGE ABSCESS- breast (Right: Breast)  Patient location during evaluation: PACU Anesthesia Type: General Level of consciousness: awake and alert Pain management: pain level controlled Vital Signs Assessment: post-procedure vital signs reviewed and stable Respiratory status: spontaneous breathing, nonlabored ventilation, respiratory function stable and patient connected to nasal cannula oxygen Cardiovascular status: blood pressure returned to baseline and stable Postop Assessment: no apparent nausea or vomiting Anesthetic complications: no   No notable events documented.   Last Vitals:  Vitals:   06/30/22 1115 06/30/22 1130  BP: 104/68 108/66  Pulse: 92 77  Resp: (!) 27 19  Temp: (!) 36.1 C   SpO2: 97% 96%    Last Pain:  Vitals:   06/30/22 1130  TempSrc:   PainSc: 0-No pain                 Cleda Mccreedy Danijela Vessey

## 2022-06-30 NOTE — ED Notes (Signed)
Report called to beth rn gyn nurse

## 2022-06-30 NOTE — Progress Notes (Signed)
Pt c/o leaking from dressing. Both breasts pumped and milk dumped. Sterile dressing changed, tegaderm and 4x4 replaced. Conform packing left inside. Dressing reinforced after approval by surgeon. Both breasts lactating and confort measures and education regarding breast care reviewed. Pt verbalizes understanding and plan to pump again at 430. Lactation/SCC informed and will follow up.

## 2022-06-30 NOTE — Op Note (Signed)
  06/29/2022 - 06/30/2022  11:00 AM  PATIENT:  Melissa Perkins  26 y.o. female  PRE-OPERATIVE DIAGNOSIS:  Right breast abscess  POST-OPERATIVE DIAGNOSIS:  Same  PROCEDURE 1. Incision and drainage of complex Deep right breast abscess 2. Excisional debridement of skin subcutaneous tissue and fascia measuring 9 square centimeters 3. Incisional biopsy of right breast r/o malignancy   EBL: minimal  SURGEON:  Surgeon(s) and Role:    * Leomar Westberg F, MD - Primary  FINDINGS: Large abscess , 100 cc foul smelling pus aspirated  ANESTHESIA: GEneral  DICTATION:  Patient was explained about the  procedure in detail. Risks, benefits, possible complications and a consent was obtained. The patient taken to the operating room and placed in the supine position. She was preped and draped in the standard fashion. Incision was created  on Right breast 12 o'clock and pus was drained and cultured. There was complex luculations that we were able to lysed with a combination of finger fracture and suction device. All the loculations were broke down. Using a sharp curette we debrided the sub q tissue down to the  fascia.  An additional incisional biopsy was done excising breast tissue with cautery to r/o malignancy. Hemostasis was obtained with electrocautery.   Irrigation with normal saline and the wound was packed with one -inch packing.  Needle and laparotomy counts were correct and there were no immediate complications.   Lesleigh Hughson Ronnette Juniper, MD

## 2022-06-30 NOTE — H&P (Signed)
History and Physical    Patient: Melissa Perkins EXH:[id number] DOB: 04/01/1996 DOA: 06/29/2022 DOS: the patient was seen and examined on 06/30/2022 PCP: Tresa Garter, MD  Patient coming from: Home  Chief Complaint:  Chief Complaint  Patient presents with   Breast Problem    HPI: Melissa Perkins is a 26 y.o. female with medical history significant for Cesarean section at [redacted]w[redacted]d on 02/28/2022 for persistent fetal tachycardia following spontaneous rupture of membranes with otherwise unremarkable postpartum course and who is currently breast-feeding, who presents to the ED with a 4-day history of a painful right right breast.  She initially thought it was a clogged milk duct but then started having a firm area of swelling just behind the nipple that became progressively larger, more tender and red to where it is now the size of a golf ball.  She denies fever or chills.  She has been using ibuprofen for the pain. ED course and data review: Vitals within normal limits.  Labs significant for WBC 13,500. Right breast ultrasound showing the following: IMPRESSION: 3.5 x 2.8 x 6.3 cm hyperemic subcutaneous mass corresponding to the palpable abnormality. There are few tiny fluid pockets within this, but no dominant drainable collection. This is most likely a phlegmon with 1 or more tiny microabscesses. Neoplasm is unlikely but not unheard of in this age group, although would not typically show this degree of through transmission. Correlate clinically with family history as to the potential need for diagnostic mammography and/or MRI. Otherwise, surgical consult and follow-up ultrasound recommended after treatment to ensure clearing.   Patient started on ceftriaxone and vancomycin. The ED provider spoke with surgeon, Dr. Everlene Farrier who recommended IR consultation for drainage with surgical consult to follow.  Hospitalist consulted for admission   Review of Systems: As mentioned in the history of  present illness. All other systems reviewed and are negative.  Past Medical History:  Diagnosis Date   Malaria    Migraines    without aura   Past Surgical History:  Procedure Laterality Date   CESAREAN SECTION N/A 02/28/2022   Procedure: CESAREAN SECTION;  Surgeon: Maxie Better, MD;  Location: [facility] LD [facility]S;  Service: Obstetrics;  Laterality: N/A;   WISDOM TOOTH EXTRACTION     Social History:  reports that she has never smoked. She has never used smokeless tobacco. She reports that she does not currently use alcohol. She reports that she does not use drugs.  Allergies  Allergen Reactions   Amoxicillin Rash    Family History  Problem Relation Age of Onset   Hypertension Father    Breast cancer Maternal Grandmother    Prostate cancer Maternal Grandfather    Colon cancer Maternal Grandfather    Heart attack Paternal Grandmother    Obesity Paternal Grandmother    Stroke Paternal Grandfather    Vascular Disease Paternal Grandfather     Prior to Admission medications   Medication Sig Start Date End Date Taking? Authorizing Provider  ferrous sulfate 325 (65 FE) MG tablet Take 325 mg by mouth daily with breakfast.    [provider]  ibuprofen (ADVIL) 600 MG tablet Take 1 tablet (600 mg total) by mouth every 6 (six) hours as needed. 03/03/22   Maxie Better, MD  Prenatal Vit-Fe Fumarate-FA (PNV PRENATAL PLUS MULTIVITAMIN) 27-1 MG TABS Take 1 tablet by mouth daily. 07/19/21   Racheal Patches, PA-C    Physical Exam: Vitals:   06/29/22 1959 06/29/22 2002  BP: 135/89   Pulse:  98   Resp: 18   Temp: 98.3 F (36.8 C)   TempSrc: Oral   SpO2: 97%   Weight:  88.5 kg  Height:  5\' 1"  (1.549 m)   Physical Exam Vitals and nursing note reviewed.  Constitutional:      General: She is not in acute distress. HENT:     Head: Normocephalic and atraumatic.  Cardiovascular:     Rate and Rhythm: Normal rate and regular rhythm.     Heart sounds: Normal heart sounds.   Pulmonary:     Effort: Pulmonary effort is normal.     Breath sounds: Normal breath sounds.  Chest:  Breasts:    Right: Skin change present.     Comments: Dome shaped fluctuant swelling superior to right nipple with surrounding erythema Abdominal:     Palpations: Abdomen is soft.     Tenderness: There is no abdominal tenderness.  Neurological:     Mental Status: Mental status is at baseline.     Labs on Admission: I have personally reviewed following labs and imaging studies  CBC: Recent Labs  Lab 06/29/22 2005  WBC 13.7*  NEUTROABS 10.6*  HGB 12.4  HCT 39.0  MCV 82.1  PLT 283   Basic Metabolic Panel: Recent Labs  Lab 06/29/22 2005  NA 139  K 4.0  CL 106  CO2 25  GLUCOSE 94  BUN 12  CREATININE 0.75  CALCIUM 9.3   GFR: Estimated Creatinine Clearance: 108.8 mL/min (by C-G formula based on SCr of 0.75 mg/dL). Liver Function Tests: Recent Labs  Lab 06/29/22 2005  AST 30  ALT 25  ALKPHOS 92  BILITOT 1.2  PROT 8.7*  ALBUMIN 4.2   No results for input(s): "LIPASE", "AMYLASE" in the last 168 hours. No results for input(s): "AMMONIA" in the last 168 hours. Coagulation Profile: No results for input(s): "INR", "PROTIME" in the last 168 hours. Cardiac Enzymes: No results for input(s): "CKTOTAL", "CKMB", "CKMBINDEX", "TROPONINI" in the last 168 hours. BNP (last 3 results) No results for input(s): "PROBNP" in the last 8760 hours. HbA1C: No results for input(s): "HGBA1C" in the last 72 hours. CBG: No results for input(s): "GLUCAP" in the last 168 hours. Lipid Profile: No results for input(s): "CHOL", "HDL", "LDLCALC", "TRIG", "CHOLHDL", "LDLDIRECT" in the last 72 hours. Thyroid Function Tests: No results for input(s): "TSH", "T4TOTAL", "FREET4", "T3FREE", "THYROIDAB" in the last 72 hours. Anemia Panel: No results for input(s): "VITAMINB12", "FOLATE", "FERRITIN", "TIBC", "IRON", "RETICCTPCT" in the last 72 hours. Urine analysis:    Component Value Date/Time    COLORURINE YELLOW 04/24/2021 1517   APPEARANCEUR CLEAR 04/24/2021 1517   LABSPEC <=1.005 (A) 04/24/2021 1517   PHURINE 6.0 04/24/2021 1517   GLUCOSEU NEGATIVE 04/24/2021 1517   HGBUR NEGATIVE 04/24/2021 1517   BILIRUBINUR NEGATIVE 04/24/2021 1517   BILIRUBINUR neg 03/12/2015 1040   KETONESUR NEGATIVE 04/24/2021 1517   PROTEINUR neg 03/12/2015 1040   UROBILINOGEN 0.2 04/24/2021 1517   NITRITE NEGATIVE 04/24/2021 1517   LEUKOCYTESUR NEGATIVE 04/24/2021 1517    Radiological Exams on Admission: No results found.   Data Reviewed: Relevant notes from primary care and specialist visits, past discharge summaries as available in EHR, including Care Everywhere. Prior diagnostic testing as pertinent to current admission diagnoses Updated medications and problem lists for reconciliation ED course, including vitals, labs, imaging, treatment and response to treatment Triage notes, nursing and pharmacy notes and ED provider's notes Notable results as noted in HPI   Assessment and Plan: * Abscess of right breast  associated with lactation Ultrasound showing the following:  "3.5 x 2.8 x 6.3 cm hyperemic subcutaneous mass corresponding to the palpable abnormality.There are few tiny fluid pockets within this, but no dominant drainable collection.This is most likely a phlegmon with 1 or more tiny microabscesses. Continue Rocephin and vancomycin Pain control Cool compresses IR consult for drainage under ultrasound guidance as recommended by surgeon Surgery consult to follow     DVT prophylaxis: SCD  Consults: Surgery, Dr Everlene Farrier  Advance Care Planning:   Code Status: Full Code   Family Communication: none  Disposition Plan: Back to previous home environment  Severity of Illness: The appropriate patient status for this patient is INPATIENT. Inpatient status is judged to be reasonable and necessary in order to provide the required intensity of service to ensure the patient's safety. The  patient's presenting symptoms, physical exam findings, and initial radiographic and laboratory data in the context of their chronic comorbidities is felt to place them at high risk for further clinical deterioration. Furthermore, it is not anticipated that the patient will be medically stable for discharge from the hospital within 2 midnights of admission.   * I certify that at the point of admission it is my clinical judgment that the patient will require inpatient hospital care spanning beyond 2 midnights from the point of admission due to high intensity of service, high risk for further deterioration and high frequency of surveillance required.*  Author: Andris Baumann, MD 06/30/2022 12:04 AM  For on call review [email or web address].

## 2022-06-30 NOTE — Transfer of Care (Signed)
Immediate Anesthesia Transfer of Care Note  Patient: Melissa Perkins  Procedure(s) Performed: INCISION AND DRAINAGE ABSCESS- breast (Right)  Patient Location: PACU  Anesthesia Type:General  Level of Consciousness: drowsy  Airway & Oxygen Therapy: Patient Spontanous Breathing and Patient connected to face mask oxygen  Post-op Assessment: Report given to RN  Post vital signs: stable  Last Vitals:  Vitals Value Taken Time  BP 98/53 06/30/22 1107  Temp    Pulse 99 06/30/22 1110  Resp 23 06/30/22 1110  SpO2 100 % 06/30/22 1110  Vitals shown include unvalidated device data.  Last Pain:  Vitals:   06/30/22 1017  TempSrc: Temporal  PainSc: 0-No pain      Patients Stated Pain Goal: 4 (06/30/22 0018)  Complications: No notable events documented.

## 2022-06-30 NOTE — Assessment & Plan Note (Addendum)
Ultrasound showing the following:  "3.5 x 2.8 x 6.3 cm hyperemic subcutaneous mass corresponding to the palpable abnormality.There are few tiny fluid pockets within this, but no dominant drainable collection.This is most likely a phlegmon with 1 or more tiny microabscesses. Continue Rocephin and vancomycin Pain control Cool compresses IR consult for drainage under ultrasound guidance as recommended by surgeon Surgery consult to follow

## 2022-06-30 NOTE — Anesthesia Procedure Notes (Signed)
Procedure Name: LMA Insertion Date/Time: 06/30/2022 10:33 AM  Performed by: Jaye Beagle, CRNAPre-anesthesia Checklist: Patient identified, Emergency Drugs available, Suction available and Patient being monitored Patient Re-evaluated:Patient Re-evaluated prior to induction Oxygen Delivery Method: Circle system utilized Preoxygenation: Pre-oxygenation with 100% oxygen Induction Type: IV induction Ventilation: Mask ventilation without difficulty LMA: LMA inserted LMA Size: 3.0 Tube type: Oral Number of attempts: 1 Airway Equipment and Method: Stylet and Oral airway Placement Confirmation: positive ETCO2 and breath sounds checked- equal and bilateral Tube secured with: Tape Dental Injury: Teeth and Oropharynx as per pre-operative assessment

## 2022-06-30 NOTE — Progress Notes (Signed)
Pharmacy Antibiotic Note  Melissa Perkins is a 26 y.o. female admitted on 06/29/2022 with cellulitis.  Pharmacy has been consulted for Vancomycin dosing for 7 days.  Plan: Pt given Vancomycin 2000 mg once. Vancomycin 1000 mg IV Q 12 hrs. Goal AUC 400-550. Expected AUC: 411.8 SCr used: 0.75  Pharmacy will continue to follow and will adjust abx dosing whenever warranted.  Height: 5\' 1"  (154.9 cm) Weight: 88.5 kg (195 lb) IBW/kg (Calculated) : 47.8  Temp (24hrs), Avg:98.3 F (36.8 C), Min:98.2 F (36.8 C), Max:98.3 F (36.8 C)   Recent Labs  Lab 06/29/22 2005  WBC 13.7*  CREATININE 0.75    Estimated Creatinine Clearance: 108.8 mL/min (by C-G formula based on SCr of 0.75 mg/dL).    Allergies  Allergen Reactions   Amoxicillin Rash    Antimicrobials this admission: 9/4 Ceftriaxone >>  9/5 Vancomycin >>   Microbiology results: No lab cultures ordered or pending at this time.  Thank you for allowing pharmacy to be a part of this patient's care.  11/5, PharmD, [facility] 06/30/2022 12:06 AM

## 2022-06-30 NOTE — Anesthesia Preprocedure Evaluation (Addendum)
Anesthesia Evaluation  Patient identified by MRN, date of birth, ID band Patient awake    Reviewed: Allergy & Precautions, NPO status , Patient's Chart, lab work & pertinent test results  History of Anesthesia Complications Negative for: history of anesthetic complications  Airway Mallampati: III  TM Distance: >3 FB Neck ROM: full    Dental  (+) Chipped   Pulmonary neg shortness of breath,    Pulmonary exam normal        Cardiovascular Exercise Tolerance: Good (-) angina(-) Past MI negative cardio ROS Normal cardiovascular exam     Neuro/Psych  Headaches, negative psych ROS   GI/Hepatic negative GI ROS, Neg liver ROS, neg GERD  ,  Endo/Other  negative endocrine ROS  Renal/GU negative Renal ROS  negative genitourinary   Musculoskeletal   Abdominal   Peds  Hematology negative hematology ROS (+)   Anesthesia Other Findings Past Medical History: No date: Malaria No date: Migraines     Comment:  without aura  Past Surgical History: 02/28/2022: CESAREAN SECTION; N/A     Comment:  Procedure: CESAREAN SECTION;  Surgeon: Maxie Better, MD;  Location: [facility] LD [facility]S;  Service:               Obstetrics;  Laterality: N/A; No date: WISDOM TOOTH EXTRACTION  BMI    Body Mass Index: 36.84 kg/m      Reproductive/Obstetrics negative OB ROS (+) Breast feeding                             Anesthesia Physical Anesthesia Plan  ASA: 2  Anesthesia Plan: General LMA   Post-op Pain Management:    Induction: Intravenous  PONV Risk Score and Plan: Dexamethasone, Ondansetron, Midazolam and Treatment may vary due to age or medical condition  Airway Management Planned: LMA  Additional Equipment:   Intra-op Plan:   Post-operative Plan: Extubation in OR  Informed Consent: I have reviewed the patients History and Physical, chart, labs and discussed the procedure including the risks,  benefits and alternatives for the proposed anesthesia with the patient or authorized representative who has indicated his/her understanding and acceptance.     Dental Advisory Given  Plan Discussed with: Anesthesiologist, CRNA and Surgeon  Anesthesia Plan Comments: (Patient consented for risks of anesthesia including but not limited to:  - adverse reactions to medications - damage to eyes, teeth, lips or other oral mucosa - nerve damage due to positioning  - sore throat or hoarseness - Damage to heart, brain, nerves, lungs, other parts of body or loss of life  Patient voiced understanding.)       Anesthesia Quick Evaluation

## 2022-06-30 NOTE — Progress Notes (Signed)
       CROSS COVER NOTE  NAME: Melissa Perkins MRN: [id number] DOB : 1996/07/26    Date of Service   06/30/2022   HPI/Events of Note   Notified by nursing that [name] experienced Itching of face and chest after vancomycin administration.   Interventions   Plan: Benadryl IV x1 Trial Benadryl pre-med for 1200 Vancomycin dose    This document was prepared using Dragon voice recognition software and may include unintentional dictation errors.  Bishop Limbo DNP, MHA, FNP-BC Nurse Practitioner [facility] Pager [phone]

## 2022-06-30 NOTE — Care Plan (Signed)
This 26 years old female with no significant past medical history who is currently breast-feeding presented in ED with 4-day history of painful right breast.  She initially thought it was a clogged milk duct but then started having swelling and pain, Swelling is getting bigger in size and more painful and red.  She denies any fever and chills.  Ultrasound showed right breast abscess.  Patient is started on IV antibiotics.  General surgery is consulted, Patient underwent successful incision and drainage. Patient was seen and examined at bedside.  All the questions answered.

## 2022-07-01 ENCOUNTER — Encounter: Payer: Self-pay | Admitting: Surgery

## 2022-07-01 ENCOUNTER — Other Ambulatory Visit: Payer: Self-pay

## 2022-07-01 DIAGNOSIS — O9113 Abscess of breast associated with lactation: Secondary | ICD-10-CM | POA: Diagnosis not present

## 2022-07-01 LAB — CBC
HCT: 36.1 % (ref 36.0–46.0)
Hemoglobin: 11.8 g/dL — ABNORMAL LOW (ref 12.0–15.0)
MCH: 26.5 pg (ref 26.0–34.0)
MCHC: 32.7 g/dL (ref 30.0–36.0)
MCV: 81.1 fL (ref 80.0–100.0)
Platelets: 260 10*3/uL (ref 150–400)
RBC: 4.45 MIL/uL (ref 3.87–5.11)
RDW: 17.1 % — ABNORMAL HIGH (ref 11.5–15.5)
WBC: 14.1 10*3/uL — ABNORMAL HIGH (ref 4.0–10.5)
nRBC: 0 % (ref 0.0–0.2)

## 2022-07-01 LAB — SURGICAL PATHOLOGY

## 2022-07-01 MED ORDER — SULFAMETHOXAZOLE-TRIMETHOPRIM 800-160 MG PO TABS: 1.0000 | ORAL_TABLET | Freq: Two times a day (BID) | ORAL | 0 refills | Status: AC

## 2022-07-01 MED ORDER — DIPHENHYDRAMINE HCL 25 MG PO CAPS
50.0000 mg | ORAL_CAPSULE | Freq: Once | ORAL | Status: AC
  Administered 2022-07-01: 50 mg via ORAL
  Filled 2022-07-01: qty 2

## 2022-07-01 NOTE — Progress Notes (Signed)
[facility] SURGICAL PROGRESS NOTE  Hospital Day(s): 2.   Post op day(s): 1 Day Post-Op.   Interval History:  Patient seen and examined No acute events or new complaints overnight.  Patient reports still with significant pain to the right breast around incision; RN staff unable to do dressing change given patient discomfort No fever, chills Slight bump in leukocytosis this morning; 14.1K; likely reactive from surgery Cx from OR growing GPC in clusters  She continues on rocephin and vancomycin    Vital signs in last 24 hours: [min-max] current  Temp:  [97 F (36.1 C)-98.8 F (37.1 C)] 98.2 F (36.8 C) (09/06 0420) Pulse Rate:  [67-100] 77 (09/06 0420) Resp:  [13-27] 18 (09/06 0420) BP: (104-137)/(66-90) 127/75 (09/06 0420) SpO2:  [95 %-100 %] 100 % (09/06 0420) Weight:  [88 kg] 88 kg (09/05 1017)     Height: 5\' 1"  (154.9 cm) Weight: 88 kg BMI (Calculated): 36.67   Intake/Output last 2 shifts:  09/05 0701 - 09/06 0700 In: 800 [I.V.:700; IV Piggyback:100] Out: -    Physical Exam:  Constitutional: alert, cooperative and no distress  Respiratory: breathing non-labored at rest  Cardiovascular: regular rate and sinus rhythm  Integumentary: RN chaperone present on examination, packing removed, there is ~ 2 x 2 x 2 cm wound to the right breast at the 12 o'clock position just superior to the areola. The surrounding tissue is indurated, no erythema. Packing replaced   Labs:     Latest Ref Rng & Units 07/01/2022    6:09 AM 06/29/2022    8:05 PM 03/01/2022    4:46 AM  CBC  WBC 4.0 - 10.5 K/uL 14.1  13.7  16.3   Hemoglobin 12.0 - 15.0 g/dL 05/01/2022  08.6  9.6   Hematocrit 36.0 - 46.0 % 36.1  39.0  29.8   Platelets 150 - 400 K/uL 260  283  131       Latest Ref Rng & Units 06/29/2022    8:05 PM 04/24/2021    3:17 PM 01/09/2020    2:40 PM  CMP  Glucose 70 - 99 mg/dL 94  87  91   BUN 6 - 20 mg/dL 12  6  4    Creatinine 0.44 - 1.00 mg/dL 01/11/2020   4.69   Sodium 135 - 145  mmol/L 139  137  137   Potassium 3.5 - 5.1 mmol/L 4.0  3.8  3.5   Chloride 98 - 111 mmol/L 106  101  106   CO2 22 - 32 mmol/L 25  27  25    Calcium 8.9 - 10.3 mg/dL 9.3  9.8  9.1   Total Protein 6.5 - 8.1 g/dL 8.7  8.1  7.2   Total Bilirubin 0.3 - 1.2 mg/dL 1.2  0.8  0.5   Alkaline Phos 38 - 126 U/L 92  64  55   AST 15 - 41 U/L 30  20  17    ALT 0 - 44 U/L 25  12  8       Imaging studies: No new pertinent imaging studies   Assessment/Plan:  26 y.o. female 1 Day Post-Op s/p incision and drainage of deep right breast abscess along with incisional breast biopsy   - Wound Care: Pack wound daily with 1 in cuneiform packing moistened with saline, cover and secure being mindful of the nipple. Patient had difficulty tolerating the procedure at bedside but was able to get through this  - Continue IV Abx (Rocephin and Vancomycin);  follow up Cx  - Pain control prn  - Further management per primary service    - Discharge Planning; Suspect she will benefit from an additional 24 hours for IV Abx and to ensure we establish a plan for wound care at home. She had a hard time with bedside changes today.    All of the above findings and recommendations were discussed with the patient, patient's family at bedside, and the medical team, and all of patient's and family's questions were answered to their expressed satisfaction.  -- Lynden Oxford, PA-C [facility] 07/01/2022, 7:51 AM M-F: 7am - 4pm

## 2022-07-01 NOTE — Lactation Note (Signed)
Lactation Consultation Note  Patient Name: Melissa Perkins Date: 07/01/2022 Initial assessment  and 1 follow up assessment completed. Reason for consult: Follow-up assessment;Engorgement (Patient with right breast abscess, breastfeeding 11 month old prior to the start of breast abscess) Age:26 y.o.  Maternal Data This is a first time mother, delivered baby 03/10/2022 SVD.Per patient's report she has been breastfeeding her 41 month old and last Thursday began having problems with a "clog" in her breast. She tried to vigorously pump and massage her right breast without improvement. She was admitted for treatment of her right breast abscess. Today patient's breasts are engorged. She has had difficulty expressing milk from both breasts, more so her right breast. She has a dressing at 12 o'oclock over the right breast abscess above her areola.   Does the patient have breastfeeding experience prior to this delivery?: No  Feeding Mother's Current Feeding Choice: Breast Milk and Formula  Patient reports baby is receiving formula by bottle for now. Mom is not comfortable giving any pumped milk even from her unaffected breast and chooses to pump and dump.  Lactation Tools Discussed/Used Breast pump type: Double-Electric Breast Pump Pump Education: Setup, frequency, and cleaning;Milk Storage;Other (comment) (For now mom prefers to pump and dump her milk. Right breast expression yields blood tinged milk) Reason for Pumping: Mother-baby separation due to mom's admittance to hospital r/t right breast abscess. Pumping frequency: every 2-3 hours Pumped volume: 60 mL (right breast expression 45 ml's(after ice pack use), left breast 15 ml's)  Interventions Interventions: DEBP;Ice;Education Assisted mom with maximizing tips and strategies fpr milk expression. Reviewed with mom light gentle massage of her breasts can be helpful when pumping, use of anti-inflammatory medications if her Provider recommends  use, ice to engorged breasts for 20-30 minutes every 2-3 hours after each pump session(can also use before pumping ) until engorgement resolves. Discussed transitional plan for breastfeeding. Reviewed tips and strategies to increase milk supply. Discussed with mom it will take 3 days to see improvement in supply once she can consistently empty her breasts. Patient verbalized understanding of information provided.  Discharge Discharge Education: Other (comment) (Engorgement management. Plan to transition baby to breastfeeding, pumping plan, tips and strategies to improve milk supply, outpatient referral to [facility] lactation if patient has questions or would like to be seen once she goes home.) Pump: Personal (Mom reports she is receiving medela electric pump today. Mom has an additional maintenance style pump that per mom is the "wearable" pump. Recommended mom use the medela pump until her abscess and supply improves.)  Consult Status Consult Status: PRN  Update provided to care nurse.  Fuller Song 07/01/2022, 1:58 PM

## 2022-07-01 NOTE — Progress Notes (Signed)
Pt may be DC today.  Gram + cocci in clusters from abscess May dc w 1 week of Bactrim or clinda We will follow as outpt

## 2022-07-01 NOTE — Discharge Summary (Signed)
Physician Discharge Summary   Patient: Melissa Perkins MRN: [id number] DOB: December 20, 1995  Admit date:     06/29/2022  Discharge date: 07/01/22  Discharge Physician: Arnetha Courser   PCP: Tresa Garter, MD   Recommendations at discharge:  Please obtain CBC and BMP in 1 week Follow-up with general surgery. Pending final wound culture results. Pending breast biopsy results.  Discharge Diagnoses: Principal Problem:   Abscess of right breast associated with lactation   Hospital Course: Taken from prior notes.  Melissa Perkins is a 26 y.o. female with medical history significant for Cesarean section at [redacted]w[redacted]d on 02/28/2022 for persistent fetal tachycardia following spontaneous rupture of membranes with otherwise unremarkable postpartum course and who is currently breast-feeding, who presents to the ED with a 4-day history of a painful right right breast.  She initially thought it was a clogged milk duct but then started having a firm area of swelling just behind the nipple that became progressively larger, more tender and red to where it is now the size of a golf ball.  She denies fever or chills.  She has been using ibuprofen for the pain. ED course and data review: Vitals within normal limits.  Labs significant for WBC 13,500. Right breast ultrasound showing the following: IMPRESSION: 3.5 x 2.8 x 6.3 cm hyperemic subcutaneous mass corresponding to the palpable abnormality. There are few tiny fluid pockets within this, but no dominant drainable collection. This is most likely a phlegmon with 1 or more tiny microabscesses. Neoplasm is unlikely but not unheard of in this age group, although would not typically show this degree of through transmission.  Patient started on ceftriaxone and vancomycin.  General surgery was consulted and she was taken to the OR for incision and drainage.  Abscess cultures growing Staph aureus, pending susceptibility. General surgery also perform biopsy to  rule out any underlying malignancy.  Patient tolerated the procedure well.  Instructions were provided for wound care and dressing change.   She was discharged on 1 week of Bactrim after discussing with pharmacy due to her history of lactation.  Patient will continue with her current medications and follow-up with general surgery for further recommendations and to discuss the results of biopsy along with final susceptibility report from wound culture.   Assessment and Plan: * Abscess of right breast associated with lactation Ultrasound showing the following:  "3.5 x 2.8 x 6.3 cm hyperemic subcutaneous mass corresponding to the palpable abnormality.There are few tiny fluid pockets within this, but no dominant drainable collection.This is most likely a phlegmon with 1 or more tiny microabscesses. Continue Rocephin and vancomycin Pain control Cool compresses IR consult for drainage under ultrasound guidance as recommended by surgeon Surgery consult to follow    Consultants: Surgery Procedures performed: Incision and drainage Disposition: Home Diet recommendation:  Discharge Diet Orders (From admission, onward)     Start     Ordered   07/01/22 0000  Diet - low sodium heart healthy        07/01/22 1313           Regular diet DISCHARGE MEDICATION: Allergies as of 07/01/2022       Reactions   Amoxicillin Rash        Medication List     TAKE these medications    ferrous sulfate 325 (65 FE) MG tablet Take 325 mg by mouth daily with breakfast.   ibuprofen 600 MG tablet Commonly known as: ADVIL Take 1 tablet (600 mg total) by mouth every  6 (six) hours as needed.   PNV Prenatal Plus Multivitamin 27-1 MG Tabs Take 1 tablet by mouth daily.   sulfamethoxazole-trimethoprim 800-160 MG tablet Commonly known as: BACTRIM DS Take 1 tablet by mouth 2 (two) times daily for 7 days.               Discharge Care Instructions  (From admission, onward)           Start      Ordered   07/01/22 0000  Discharge wound care:       Comments: Pack wound daily w roll gauze ( conform or any roll gauze will do) May need to do prn during the day if dressing soaks. May apply 4x4 or ABD pads on top of packing. May shower daily and continue to pump   07/01/22 1313   07/01/22 0000  Discharge wound care:       Comments: Please change and pack wound Right breast daily and prn w 1 inch packing ( conform)   07/01/22 1355            Follow-up Information     Pabon, Diego F, MD Follow up in 2 week(s).   Specialty: General Surgery Contact information: [address] [place] [address] [phone]                Discharge Exam: Ceasar Mons Weights   06/29/22 2002 06/30/22 1017  Weight: 88.5 kg 88 kg   General.     In no acute distress.  Clean bandage on right breast. Pulmonary.  Lungs clear bilaterally, normal respiratory effort. CV.  Regular rate and rhythm, no JVD, rub or murmur. Abdomen.  Soft, nontender, nondistended, BS positive. CNS.  Alert and oriented .  No focal neurologic deficit. Extremities.  No edema, no cyanosis, pulses intact and symmetrical. Psychiatry.  Judgment and insight appears normal.   Condition at discharge: stable  The results of significant diagnostics from this hospitalization (including imaging, microbiology, ancillary and laboratory) are listed below for reference.   Imaging Studies: US BREAST LTD UNI RIGHT INC AXILLA  Result Date: 06/29/2022 CLINICAL DATA:  1842. 26 year old female presenting with painful mass right breast, concern for abscess. EXAM: ULTRASOUND OF THE RIGHT BREAST COMPARISON:  None available. FINDINGS: On physical exam according to the technologist, there is a red swollen lump in the right breast at 12 o'clock just above the nipple. Targeted ultrasound is performed, showing an ovoid to elliptical shaped, slightly lobulated well-circumscribed subcutaneous mass with a mostly solid architecture  and moderate hyperemia. The mass demonstrates through transmission and edge shadowing as well as a few tiny fluid pockets without a dominant, drainable fluid pocket. It is predominantly of a hypoechoic solid architecture and measures 3.5 x 2.8 x 6.3 cm. IMPRESSION: 3.5 x 2.8 x 6.3 cm hyperemic subcutaneous mass corresponding to the palpable abnormality. There are few tiny fluid pockets within this, but no dominant drainable collection. This is most likely a phlegmon with 1 or more tiny microabscesses. Neoplasm is unlikely but not unheard of in this age group, although would not typically show this degree of through transmission. Correlate clinically with family history as to the potential need for diagnostic mammography and/or MRI. Otherwise, surgical consult and follow-up ultrasound recommended after treatment to ensure clearing. Electronically Signed   By: Almira Bar M.D.   On: 06/29/2022 23:15    Microbiology: Results for orders placed or performed during the hospital encounter of 06/29/22  Aerobic/Anaerobic Culture w Gram Stain (surgical/deep wound)  Status: None (Preliminary result)   Collection Time: 06/30/22 10:50 AM   Specimen: PATH Other; Tissue  Result Value Ref Range Status   Specimen Description   Final    OTHER Performed at [facility], [address], [place], [place] [address]    Special Requests   Final    NONE Performed at [facility], [address] [place], [place] [address]    Gram Stain   Final    MODERATE WBC PRESENT, PREDOMINANTLY PMN MODERATE GRAM POSITIVE COCCI IN CLUSTERS    Culture   Final    MODERATE STAPHYLOCOCCUS AUREUS SUSCEPTIBILITIES TO FOLLOW Performed at [facility], [address] [place], [place] [address]    Report Status PENDING  Incomplete    Labs: CBC: Recent Labs  Lab 06/29/22 2005 07/01/22 0609  WBC 13.7* 14.1*  NEUTROABS 10.6*  --   HGB 12.4 11.8*  HCT 39.0 36.1  MCV 82.1 81.1  PLT 283 260    Basic Metabolic Panel: Recent Labs  Lab 06/29/22 2005  NA 139  K 4.0  CL 106  CO2 25  GLUCOSE 94  BUN 12  CREATININE 0.75  CALCIUM 9.3   Liver Function Tests: Recent Labs  Lab 06/29/22 2005  AST 30  ALT 25  ALKPHOS 92  BILITOT 1.2  PROT 8.7*  ALBUMIN 4.2   CBG: No results for input(s): "GLUCAP" in the last 168 hours.  Discharge time spent: greater than 30 minutes.  This record has been created using Conservation officer, historic buildings. Errors have been sought and corrected,but may not always be located. Such creation errors do not reflect on the standard of care.   Signed: Arnetha Courser, MD [facility] 07/01/2022

## 2022-07-01 NOTE — Hospital Course (Signed)
Taken from prior notes.  Melissa Perkins is a 26 y.o. female with medical history significant for Cesarean section at [redacted]w[redacted]d on 02/28/2022 for persistent fetal tachycardia following spontaneous rupture of membranes with otherwise unremarkable postpartum course and who is currently breast-feeding, who presents to the ED with a 4-day history of a painful right right breast.  She initially thought it was a clogged milk duct but then started having a firm area of swelling just behind the nipple that became progressively larger, more tender and red to where it is now the size of a golf ball.  She denies fever or chills.  She has been using ibuprofen for the pain. ED course and data review: Vitals within normal limits.  Labs significant for WBC 13,500. Right breast ultrasound showing the following: IMPRESSION: 3.5 x 2.8 x 6.3 cm hyperemic subcutaneous mass corresponding to the palpable abnormality. There are few tiny fluid pockets within this, but no dominant drainable collection. This is most likely a phlegmon with 1 or more tiny microabscesses. Neoplasm is unlikely but not unheard of in this age group, although would not typically show this degree of through transmission.  Patient started on ceftriaxone and vancomycin.  General surgery was consulted and she was taken to the OR for incision and drainage.  Abscess cultures growing Staph aureus, pending susceptibility. General surgery also perform biopsy to rule out any underlying malignancy.  Patient tolerated the procedure well.  Instructions were provided for wound care and dressing change.   She was discharged on 1 week of Bactrim after discussing with pharmacy due to her history of lactation.  Patient will continue with her current medications and follow-up with general surgery for further recommendations and to discuss the results of biopsy along with final susceptibility report from wound culture.

## 2022-07-01 NOTE — Progress Notes (Signed)
RN taught patient and patients husband how to change dressing and pack wound. Patient had questions related to post op care and wound care.RN was able to answer all patients questions. Patient wheeled out by Nurse Aid.

## 2022-07-01 NOTE — Discharge Instructions (Signed)
Wound Packing Wound packing usually involves placing a moistened packing material into your wound and then covering it with an outer bandage (dressing). This helps support the healing of deep tissue and tissue under the skin. It also helps prevent bleeding, infection, and further injury. Wounds are packed until deep tissues heal. The time it takes for this to happen is different for everyone. Your health care provider will show you how to pack and dress your wound. Using gloves and a clean technique is important to avoid spreading germs into your wound. Supplies needed: Soap and water. Disposable gloves. Cleansing or wetting solution, such as saline, germ-free (sterile) water, or an antiseptic solution. Clean bowl. Clean packing material, such as gauze, gauze sponges, or rolled gauze. Clean paper towels. Outer dressing. This includes the cover dressing and tape, or a dressing with an adhesive border. Cotton-tipped swabs. Small plastic bag for trash. How to pack your wound Follow your health care provider's instructions on how often you need to change dressings and pack your wound. You will likely be asked to change your dressings 1 to 2 times a day. Preparing to change the wound packing If needed, take pain medicine 30 minutes before you pack your wound as told by your health care provider. Preparing the new packing material Washing hands with soap and water.   Clean and disinfect your work surface or countertop. Set a plastic bag on or near your work surface. Wash your hands with soap and water for at least 20 seconds before you change the dressing. If soap and water are not available, use hand sanitizer. Put a clean paper towel on the counter. Put a clean bowl on the towel. Only touch the outside of the bowl when handling it. Pour the cleansing or wetting solution that your health care provider tells you to use into the bowl. Select and cut your packing material to fit the size of your  wound. Avoid using multiple pieces of packing material. Drop it into the bowl. Cut tape strips that you will use to seal the outer dressing, if needed. Put gauze pads for cleansing and cotton-tipped swabs on the clean paper towel. Removing the old packing material and dressing Put on a set of gloves. Gently remove the old dressing and packing material. Make sure to check how the drainage looks or if there is any odor. Clean or rinse (irrigate) the wound. Remove your gloves. Put the removed items, including gloves, into the plastic bag to throw away later. Wash your hands again with soap and water for at least 20 seconds. If soap and water are not available, use hand sanitizer. Applying the new packing material and dressing A cotton-tipped swab being used to guide packing material into a wound.   Put on a new set of gloves. Squeeze the packing material in the bowl to release the extra liquid. The packing material should be moist, but not dripping wet. Gently place the packing material into the wound. Use a cotton-tipped swab to guide it into place, filling all of the space. Do not overpack the wound bed. Dry your gloved fingertips on the paper towel. Open up your outer dressing supplies and put them on a dry part of the paper towel. Keep them from getting wet. Place the outer dressing over the packed wound. Tape the edges of the outer dressing in place. Remove your gloves. Wash your hands again with soap and water for at least 20 seconds. If soap and water are not available, use  hand sanitizer. Put the removed items, including gloves, into the plastic bag to throw away. Clean and disinfect your work surface or countertop. General tips Follow your health care provider's instructions on how much to pack the wound. At first, you may need to pack it more fully to help stop bleeding. As the wound begins to heal inside, you will use less packing material and pack the wound loosely to allow the  tissue to heal slowly from the inside out. Do not take baths, swim, or use a hot tub until your health care provider approves. Ask your health care provider if you may take showers. You may only be allowed to take sponge baths. Keep the dressing clean and dry. Follow any other instructions given by your health care provider on how to aid healing. This may include applying warm or cold compresses, raising (elevating) the affected area, or wearing a compression dressing. Check your wound site every day for signs of infection. Check for: More redness, swelling, or pain. More fluid or blood. Warmth or hardness (induration). Pus or a bad smell. Protect your wound from the sun when you are outside for the first 6 months, or for as long as told by your health care provider. Cover up the scar area or apply sunscreen that has an SPF of at least 30. Keep all follow-up visits. This is important. Contact a health care provider if: Your pain is not controlled with pain medicine. You have more drainage, redness, swelling, or pain at your wound site. You have new rash, warmth, or induration around the wound. You have a fever or chills. Your wound becomes larger or deeper. Get help right away if: The tissue inside your wound changes color from pink to white, yellow, or black. You notice a bad smell or pus coming from the wound site. You are having trouble packing your wound. Your wound is bleeding, and the bleeding does not stop with gentle pressure. These symptoms may represent a serious problem that is an emergency. Do not wait to see if the symptoms will go away. Get medical help right away. Call your local emergency services (911 in the [place] Do not drive yourself to the hospital. Summary Wound packing usually involves placing a moistened packing material into your wound and then covering it with an outer bandage (dressing). Follow your health care provider's instructions on how often you need to  change dressings and pack your wound. You will likely be asked to change dressings 1 to 2 times a day. When packing your wound, it is important to use gloves to avoid spreading germs into the wound. Check your wound site every day for signs of infection. This information is not intended to replace advice given to you by your health care provider. Make sure you discuss any questions you have with your health care provider. Document Revised: 02/18/2021 Document Reviewed: 02/18/2021 Elsevier Patient Education  2023 [employer] Inc[employer]     Breastfeeding and Mastitis Mastitis is inflammation of the breast tissue. It can occur in women who are breastfeeding. This can make breastfeeding painful. Mastitis will sometimes go away on its own with continued breastfeeding or pumping, especially if it is not caused by an infection (noninfectious mastitis). A health care provider will help determine if medical treatment is needed. What are the causes? Inflammation may be caused by a blocked milkduct, which can happen when too much milk builds up in the breast. Causes of excess milk in the breast can include: Poor latch. If  your child is not latched on to your breast properly, he or she may not empty your breast completely while breastfeeding. Allowing too much time to pass between feedings, or scheduling feedings instead of feeding on demand. Wearing a bra or other clothing that is too tight. This puts extra pressure on your milk ducts so milk does not flow through them as it should. Milk remaining in the breast because it is overfilled (engorged). This is often from oversupply or missed feedings. Mastitis is often caused by a bacterial infection. Bacteria may enter the breast tissue through cuts, cracks, or openings in the skin near the nipple area. Cracks in the skin often develop when your child does not latch on properly to your breast. What increases the risk? The following factors may make you more likely to  develop this condition: History of mastitis. Poor breastfeeding technique. Poor nutrition and hydration. Smoking. Stress. Tiredness (fatigue). What are the signs or symptoms? Front view of a breast showing a blocked milk duct and an area of inflammation.   Symptoms of this condition include: Swelling, redness, tenderness, and pain in an area of your breast. These usually affect the upper part of the breast, toward the armpit region. In most cases, these symptoms affect only one breast. In some cases, the symptoms may occur in both breasts at the same time and affect a larger portion of breast tissue. Swelling of the glands under your arm on the same side. Fatigue, headache, and flu-like muscle aches. Fever. Rapid pulse. Symptoms usually last 2-5 days. Breast pain and redness are at their worst on days 2 and 3, and they usually go away by day 5. If an infection is left to worsen, a collection of pus, or an abscess, may develop. How is this diagnosed? This condition is usually diagnosed based on: Your symptoms. A physical exam. In some cases, tests may be done, such as: Blood tests to check whether your body is fighting a bacterial infection. Mammogram or ultrasound tests to rule out other problems or diseases. Fluid tests. If an abscess has developed, the fluid in the abscess may be removed with a needle. The fluid may be checked to see whether bacteria are present. Breast milk testing. A sample of your breast milk may be tested for bacteria. How is this treated? This condition will sometimes go away on its own with continued breastfeeding or pumping. Your health care provider may choose to wait 24 hours after first seeing you to decide whether treatment is needed. If treatment is needed, it may include: Continuing to breastfeed or pump from both breasts to allow adequate milk flow and prevent an abscess from forming. Strategies to support breastfeeding include: Breastfeeding from the  affected side first. Using gentle breast massage. Your health care provider or a breastfeeding specialist (lactation consultant) can show you how to perform breast massage. Applying warmth to the affected area before breastfeeding. This can help with milk flow. Try a warm cloth or warm shower. Self-care, such as rest and drinking more fluids. Pain medicine. Antibiotic medicine to treat a bacterial infection. Using a fine needle to remove fluid from an abscess if one has developed. Follow these instructions at home: Medicines Take over-the-counter and prescription medicines only as told by your health care provider. If you were prescribed an antibiotic medicine, take it as told by your health care provider. Do not stop taking the antibiotic even if you start to feel better. Managing pain and swelling A person with a cold  compress on the side of the breast.   If directed, put ice on the affected area of your breast right after breastfeeding or pumping. To do this: Put ice in a plastic bag. Place a towel between your skin and the bag. Leave the ice on for 20 minutes, 2-3 times a day. Remove the ice if your skin turns bright red. This is very important. If you cannot feel pain, heat, or cold, you have a greater risk of damage to the area.   Breast care Keep your nipples clean and dry. Do not wear a tight or underwire bra. Wear a soft, supportive bra. Check your nipples for any cracks or blisters. If you find any, talk with your health care provider or lactation consultant for treatment. Breastfeeding and pumping tips Continue to breastfeed your baby on demand. This means feeding your baby whenever he or she is hungry. Ask your health care provider or lactation consultant whether you need to change your breastfeeding or pumping routine. Alternate the breast you offer first at each feeding to make sure your baby feeds from both breasts equally. Offer both breasts to your baby at every  feeding. Avoid using nipple shields for feedings, if possible. Ask a lactation consultant for assistance if needed. If directed, apply moist heat to the affected area of your breast right before breastfeeding or pumping. Use the heat source that your health care provider recommends. Use gentle breast massage during feeding or pumping sessions only as told by your health care provider or lactation consultant. Avoid allowing your breasts to become engorged. If your breasts are engorged, you can hand express a small amount of milk for comfort. If you are pumping, continue to pump on the same schedule as you were before. In the breast with mastitis, pump until very little milk is coming out. Do not empty your breast. Emptying your breast causes your body to make more milk and can make symptoms worse. General instructions Drink enough fluid to keep your urine pale yellow. This is especially important if you have a fever. Get plenty of rest. Wash your hands often with soap and water for at least 20 seconds. Wash pump parts using the manufacturer's instructions. Keep all follow-up visits. This is important. Where to find more information [employer]: llli.org Contact a health care provider if: You have pus-like discharge from your breast. You have a fever. Your symptoms do not improve within 2 days of starting treatment. Your symptoms return after you have recovered from a breast infection. Get help right away if: Your pain and swelling are getting worse. You have pain that is not controlled with medicine. You have a red line going from your breast toward your armpit. Summary Mastitis is inflammation of the breast tissue. It is often caused by a blocked milk duct or bacteria. If needed, treatment may include continuing to breastfeed or pump, applying warmth or cold and other breastfeeding strategies, self-care, and medicines. Continue to breastfeed your baby on demand or pump on  the same schedule as you were before. If you were prescribed an antibiotic medicine, take it as told by your health care provider. Do not stop taking the antibiotic even if you start to feel better. This information is not intended to replace advice given to you by your health care provider. Make sure you discuss any questions you have with your health care provider. Document Revised: 11/14/2021 Document Reviewed: 02/14/2020 Elsevier Patient Education  2023 [employer].

## 2022-07-02 ENCOUNTER — Telehealth: Payer: Self-pay | Admitting: *Deleted

## 2022-07-02 ENCOUNTER — Telehealth: Payer: Self-pay | Admitting: Surgery

## 2022-07-02 ENCOUNTER — Other Ambulatory Visit: Payer: Self-pay

## 2022-07-02 ENCOUNTER — Emergency Department
Admission: EM | Admit: 2022-07-02 | Discharge: 2022-07-02 | Disposition: A | Discharge status: Home / Self Care | Payer: Managed Care, Other (non HMO) | Attending: Emergency Medicine | Admitting: Emergency Medicine

## 2022-07-02 DIAGNOSIS — Z4889 Encounter for other specified surgical aftercare: Secondary | ICD-10-CM

## 2022-07-02 DIAGNOSIS — Z4800 Encounter for change or removal of nonsurgical wound dressing: Secondary | ICD-10-CM | POA: Diagnosis present

## 2022-07-02 NOTE — Telephone Encounter (Signed)
Breast incision and drainage yesterday

## 2022-07-02 NOTE — Telephone Encounter (Signed)
Patient calls, she was discharged from the hospital after having I & D right breast abscess done on 06/30/22 with Dr. Everlene Farrier. They state that the discharge instructions that they were given to them, they are not quite clear on.  They were given some supplies for the packing of her wound and appears that some of the supplies were not there for them to have in order to properly change her dressing.  Please call them as they state it is time for dressing change and they need guidance with the supplies.  Thank you.

## 2022-07-02 NOTE — ED Triage Notes (Signed)
Pt presents to ED with c/o of needing a dressing change to recent breast surgery. Pt state she had a cyst removed to R breast this past Tuesday.   Pt state she is not able to put the dressing on herself and wants to speak to Dr. Meredeth Ide about healing process.

## 2022-07-02 NOTE — Telephone Encounter (Signed)
Spoke with patient regarding wound care-he is unclear as to how to care for the wound-they will come by the office tomorrow for instructions.

## 2022-07-02 NOTE — ED Provider Notes (Signed)
   [facility] Provider Note    Event Date/Time   First MD Initiated Contact with Patient 07/02/22 1242     (approximate)   History   Dressing Change   HPI  Melissa Perkins is a 26 y.o. female   presents to the ED needing a change of dressing for her recent breast surgery.  Patient had a cyst removed from her right breast on 06/30/2022 and is to follow-up with Dr. Everlene Farrier in the office.  Patient denies any fever, chills or increased pain.  Patient states that she is unable to change her dressing herself.  Mother is here with patient and willing to learn while watching it be changed today.      Physical Exam   Triage Vital Signs: ED Triage Vitals [07/02/22 1208]  Enc Vitals Group     BP 135/88     Pulse Rate 89     Resp 17     Temp 97.8 F (36.6 C)     Temp Source Oral     SpO2 99 %     Weight      Height      Head Circumference      Peak Flow      Pain Score 6     Pain Loc      Pain Edu?      Excl. in GC?     Most recent vital signs: Vitals:   07/02/22 1208  BP: 135/88  Pulse: 89  Resp: 17  Temp: 97.8 F (36.6 C)  SpO2: 99%     General: Awake, no distress.  CV:  Good peripheral perfusion.  Resp:  Normal effort.  Abd:  No distention.  Other:  Surgical wound right breast superior to the nipple without active drainage.  Clear margins and appears to be healing without any signs of infection.   ED Results / Procedures / Treatments   Labs (all labs ordered are listed, but only abnormal results are displayed) Labs Reviewed - No data to display    PROCEDURES:  Critical Care performed:   Procedures   MEDICATIONS ORDERED IN ED: Medications - No data to display   IMPRESSION / MDM / ASSESSMENT AND PLAN / ED COURSE  I reviewed the triage vital signs and the nursing notes.   Differential diagnosis includes, but is not limited to, dressing change.  26 year old female presents to the ED for dressing change from her recent  surgery on 06/30/22.  Area appears to be healing well with minimal drainage.  Nursing staff spent time showing mother how to change the dressing.  Patient was also strongly encouraged to call today to make an appointment with Dr. Alphonsa Overall bone if she has not done this yet.  Discharged with a better understanding of changing her dressing but was told to return to the emergency department or follow-up with urgent care as needed.      Patient's presentation is most consistent with acute, uncomplicated illness.  FINAL CLINICAL IMPRESSION(S) / ED DIAGNOSES   Final diagnoses:  Encounter for post surgical wound check     Rx / DC Orders   ED Discharge Orders     None        Note:  This document was prepared using Dragon voice recognition software and may include unintentional dictation errors.   Tommi Rumps, PA-C 07/02/22 1433    Minna Antis, MD 07/02/22 1453

## 2022-07-02 NOTE — ED Notes (Signed)
Wet to dry dressing done on pt's right breast, procedure demonstrated and verbalized to mother and pt's significant other, pt and mother and s/o state understanding. Signs and symptoms of infection reviewed and pt and mother and s/o state understanding.

## 2022-07-02 NOTE — Discharge Instructions (Signed)
Continue keep the area clean and dry.  Change dressing daily.  Call Dr. Hurman Horn office today to make a follow-up appointment in the office.  Return to the emergency department if any concerns of infection or if you begin running fever over 100.

## 2022-07-02 NOTE — Telephone Encounter (Signed)
Pt is currently admitted at [facility] EMERGENCY DEPARTMENT.Marland KitchenRaechel Perkins

## 2022-07-03 ENCOUNTER — Ambulatory Visit: Payer: Managed Care, Other (non HMO)

## 2022-07-03 DIAGNOSIS — O9113 Abscess of breast associated with lactation: Secondary | ICD-10-CM

## 2022-07-03 NOTE — Progress Notes (Unsigned)
Patient came to office today unclear as to how to pack her wound. We discussed step by step instructions.as I was changing the packing material and placing new packing material.  The old packing was removed-wound looked good- beefy red-minimal bleeding- Moistened 4x4 gauze was placed into wound bed- being careful not to allow overlapping of edges of wound. Dry gauze was placed over the wound and secured with 4x4 Tegaderm. Extra supplies were provided for patient .

## 2022-07-05 LAB — AEROBIC/ANAEROBIC CULTURE W GRAM STAIN (SURGICAL/DEEP WOUND)

## 2022-07-07 ENCOUNTER — Telehealth: Payer: Self-pay | Admitting: Internal Medicine

## 2022-07-07 NOTE — Telephone Encounter (Signed)
Patient is calling to check status of medical necessity paperwork. Call back number is [phone]

## 2022-07-07 NOTE — Telephone Encounter (Signed)
Pt need to make appt w/ Dr. Posey Rea. She has not seen MD since 12/2019 for FMLA. Pls call and schedule.Marland KitchenRaechel Chute

## 2022-07-08 NOTE — Telephone Encounter (Signed)
Pt has appt 07/21/22 for FMLA.Melissa KitchenRaechel Chute

## 2022-07-08 NOTE — Telephone Encounter (Signed)
Notesd.Marland Kitchen appt 07/21/22.Marland KitchenFMLA at my desk.Marland KitchenRaechel Chute

## 2022-07-14 ENCOUNTER — Encounter: Payer: Self-pay | Admitting: Physician Assistant

## 2022-07-14 ENCOUNTER — Ambulatory Visit (INDEPENDENT_AMBULATORY_CARE_PROVIDER_SITE_OTHER): Payer: Managed Care, Other (non HMO) | Admitting: Physician Assistant

## 2022-07-14 VITALS — BP 126/80 | HR 85 | Temp 97.5°F | Wt 192.0 lb

## 2022-07-14 DIAGNOSIS — Z09 Encounter for follow-up examination after completed treatment for conditions other than malignant neoplasm: Secondary | ICD-10-CM

## 2022-07-14 DIAGNOSIS — N611 Abscess of the breast and nipple: Secondary | ICD-10-CM

## 2022-07-14 DIAGNOSIS — O9113 Abscess of breast associated with lactation: Secondary | ICD-10-CM

## 2022-07-14 NOTE — Patient Instructions (Addendum)
If you have any concerns or questions, please feel free to call our office. See follow up appointment below.   Incision and Drainage, Care After This sheet gives you information about how to care for yourself after your procedure. Your health care provider may also give you more specific instructions. If you have problems or questions, contact your health care provider. What can I expect after the procedure? After the procedure, it is common to have: Pain or discomfort around the incision site. Blood, fluid, or pus (drainage) from the incision. Redness and firm skin around the incision site. Follow these instructions at home: Medicines Take over-the-counter and prescription medicines only as told by your health care provider. If you were prescribed an antibiotic medicine, use or take it as told by your health care provider. Do not stop using the antibiotic even if you start to feel better. Wound care Follow instructions from your health care provider about how to take care of your wound. Make sure you: Wash your hands with soap and water before and after you change your bandage (dressing). If soap and water are not available, use hand sanitizer. Change your dressing and packing as told by your health care provider. If your dressing is dry or stuck when you try to remove it, moisten or wet the dressing with saline or water so that it can be removed without harming your skin or tissues. If your wound is packed, leave it in place until your health care provider tells you to remove it. To remove the packing, moisten or wet the packing with saline or water so that it can be removed without harming your skin or tissues. Leave stitches (sutures), skin glue, or adhesive strips in place. These skin closures may need to stay in place for 2 weeks or longer. If adhesive strip edges start to loosen and curl up, you may trim the loose edges. Do not remove adhesive strips completely unless your health care  provider tells you to do that. Check your wound every day for signs of infection. Check for: More redness, swelling, or pain. More fluid or blood. Warmth. Pus or a bad smell. If you were sent home with a drain tube in place, follow instructions from your health care provider about: How to empty it. How to care for it at home.  General instructions Rest the affected area. Do not take baths, swim, or use a hot tub until your health care provider approves. Ask your health care provider if you may take showers. You may only be allowed to take sponge baths. Return to your normal activities as told by your health care provider. Ask your health care provider what activities are safe for you. Your health care provider may put you on activity or lifting restrictions. The incision will continue to drain. It is normal to have some clear or slightly bloody drainage. The amount of drainage should lessen each day. Do not apply any creams, ointments, or liquids unless you have been told to by your health care provider. Keep all follow-up visits as told by your health care provider. This is important. Contact a health care provider if: Your cyst or abscess returns. You have more redness, swelling, or pain around your incision. You have more fluid or blood coming from your incision. Your incision feels warm to the touch. You have pus or a bad smell coming from your incision. You have red streaks above or below the incision site. Get help right away if: You have severe   pain or bleeding. You cannot eat or drink without vomiting. You have a fever or chills. You have redness that spreads quickly. You have decreased urine output. You become short of breath. You have chest pain. You cough up blood. The affected area becomes numb or starts to tingle. These symptoms may represent a serious problem that is an emergency. Do not wait to see if the symptoms will go away. Get medical help right away. Call your  local emergency services (911 in the [place] Do not drive yourself to the hospital. Summary After this procedure, it is common to have fluid, blood, or pus coming from the surgery site. Follow all home care instructions. You will be told how to take care of your incision, how to check for infection, and how to take medicines. If you were prescribed an antibiotic medicine, take it as told by your health care provider. Do not stop taking the antibiotic even if you start to feel better. Contact a health care provider if you have increased redness, swelling, or pain around your incision. Get help right away if you have chest pain, you vomit, you cough up blood, or you have shortness of breath. Keep all follow-up visits as told by your health care provider. This is important. This information is not intended to replace advice given to you by your health care provider. Make sure you discuss any questions you have with your health care provider. Document Revised: 01/15/2022 Document Reviewed: 07/24/2021 Elsevier Patient Education  2023 Elsevier Inc.  

## 2022-07-14 NOTE — Progress Notes (Signed)
[facility] POST-OP OFFICE VISIT  07/14/2022  HPI: Melissa Perkins is a 26 y.o. female 14 days s/p incision and drainage of deep right breast abscess along with incisional breast biopsy with Dr Dahlia Byes   She reports that she is doing markedly better Essentially pain free No fever, chills She continues to pack the wound but is using very minimal now Still with some drainage; this is milk as she is breast feeding Cx grew MSSA; sent home on Bactrim; she has completed this course No other complaints    Vital signs: BP 126/80   Pulse 85   Temp (!) 97.5 F (36.4 C) (Oral)   Wt 192 lb (87.1 kg)   SpO2 98%   BMI 36.28 kg/m    Physical Exam: Constitutional: Well appearing female, NAD Skin: Levada Dy present as chaperone, to the 12 o'clock position just superior to the areola on the right breast there is a 2 x 1 x <0.5 cm wound, wound bed is healthy granulation tissue, there is a small amount of milk draining from the inferior edge, no erythema, non-tender  Assessment/Plan: This is a 26 y.o. female 14 days s/p incision and drainage of deep right breast abscess along with incisional breast biopsy with Dr Dahlia Byes    - Reviewed wound care recommendation; we can transition to superficial dressings at this stage given lack of depth. Continue daily and as needed. Suspect these will need changed more frequently given the milky drainage while she is breast feeding.   - No need for further Abx - Pain control prn - I will see her again in 2-3 weeks for recheck; She understands to call with questions/concerns  -- Edison Simon, PA-C [facility] 07/14/2022, 3:29 PM M-F: 7am - 4pm

## 2022-07-21 ENCOUNTER — Ambulatory Visit (INDEPENDENT_AMBULATORY_CARE_PROVIDER_SITE_OTHER): Payer: Managed Care, Other (non HMO) | Admitting: Internal Medicine

## 2022-07-21 ENCOUNTER — Encounter: Payer: Self-pay | Admitting: Internal Medicine

## 2022-07-21 VITALS — BP 110/58 | HR 79 | Temp 98.7°F | Ht 59.5 in | Wt 195.8 lb

## 2022-07-21 DIAGNOSIS — Z0000 Encounter for general adult medical examination without abnormal findings: Secondary | ICD-10-CM | POA: Diagnosis not present

## 2022-07-21 DIAGNOSIS — O9113 Abscess of breast associated with lactation: Secondary | ICD-10-CM | POA: Diagnosis not present

## 2022-07-21 NOTE — Assessment & Plan Note (Signed)
  We discussed age appropriate health related issues, including available/recomended screening tests and vaccinations. Labs were ordered to be later reviewed . All questions were answered. We discussed one or more of the following - seat belt use, use of sunscreen/sun exposure exercise, fall risk reduction, second hand smoke exposure, firearm use and storage, seat belt use, a need for adhering to healthy diet and exercise. Labs were ordered.  All questions were answered. Take Ca and Vit D while breast feeding

## 2022-07-21 NOTE — Assessment & Plan Note (Signed)
Mastitis R - s/p surgery on Sept 5, 2023 - healing

## 2022-07-21 NOTE — Progress Notes (Signed)
Subjective:  Patient ID: Melissa Perkins, female    DOB: 02-29-1996  Age: 26 y.o. MRN: [id number]  CC: Follow-up (No longer need FMLA form fill out went to surgeon)   HPI REGENA DELUCCHI presents for a well exam  Mastitis R - s/p surgery on Sept 5, 2023  Outpatient Medications Prior to Visit  Medication Sig Dispense Refill   ferrous sulfate 325 (65 FE) MG tablet Take 325 mg by mouth daily with breakfast.     Prenatal Vit-Fe Fumarate-FA (PNV PRENATAL PLUS MULTIVITAMIN) 27-1 MG TABS Take 1 tablet by mouth daily. 30 tablet 0   ibuprofen (ADVIL) 600 MG tablet Take 1 tablet (600 mg total) by mouth every 6 (six) hours as needed. (Patient not taking: Reported on 07/21/2022) 30 tablet 11   No facility-administered medications prior to visit.    ROS: Review of Systems  Constitutional:  Positive for unexpected weight change. Negative for activity change, appetite change, chills and fatigue.  HENT:  Negative for congestion, mouth sores and sinus pressure.   Eyes:  Negative for visual disturbance.  Respiratory:  Negative for cough and chest tightness.   Gastrointestinal:  Negative for abdominal pain and nausea.  Genitourinary:  Negative for difficulty urinating, frequency and vaginal pain.  Musculoskeletal:  Negative for back pain and gait problem.  Skin:  Positive for wound. Negative for pallor and rash.  Neurological:  Negative for dizziness, tremors, weakness, numbness and headaches.  Psychiatric/Behavioral:  Negative for confusion and sleep disturbance.     Objective:  BP (!) 110/58 (BP Location: Left Arm)   Pulse 79   Temp 98.7 F (37.1 C) (Oral)   Ht 4' 11.5" (1.511 m)   Wt 195 lb 12.8 oz (88.8 kg)   SpO2 99%   BMI 38.88 kg/m   BP Readings from Last 3 Encounters:  07/21/22 (!) 110/58  07/14/22 126/80  07/02/22 135/88    Wt Readings from Last 3 Encounters:  07/21/22 195 lb 12.8 oz (88.8 kg)  07/14/22 192 lb (87.1 kg)  07/02/22 193 lb 9 oz (87.8 kg)    Physical  Exam Constitutional:      General: She is not in acute distress.    Appearance: Normal appearance. She is well-developed. She is obese.  HENT:     Head: Normocephalic.     Right Ear: External ear normal.     Left Ear: External ear normal.     Nose: Nose normal.  Eyes:     General:        Right eye: No discharge.        Left eye: No discharge.     Conjunctiva/sclera: Conjunctivae normal.     Pupils: Pupils are equal, round, and reactive to light.  Neck:     Thyroid: No thyromegaly.     Vascular: No JVD.     Trachea: No tracheal deviation.  Cardiovascular:     Rate and Rhythm: Normal rate and regular rhythm.     Heart sounds: Normal heart sounds.  Pulmonary:     Effort: No respiratory distress.     Breath sounds: No stridor. No wheezing.  Abdominal:     General: Bowel sounds are normal. There is no distension.     Palpations: Abdomen is soft. There is no mass.     Tenderness: There is no abdominal tenderness. There is no guarding or rebound.  Musculoskeletal:        General: No tenderness.     Cervical back: Normal range of  motion and neck supple. No rigidity.  Lymphadenopathy:     Cervical: No cervical adenopathy.  Skin:    Findings: No erythema or rash.  Neurological:     Mental Status: She is oriented to person, place, and time.     Cranial Nerves: No cranial nerve deficit.     Motor: No abnormal muscle tone.     Coordination: Coordination normal.     Deep Tendon Reflexes: Reflexes normal.  Psychiatric:        Behavior: Behavior normal.        Thought Content: Thought content normal.        Judgment: Judgment normal.     Lab Results  Component Value Date   WBC 14.1 (H) 07/01/2022   HGB 11.8 (L) 07/01/2022   HCT 36.1 07/01/2022   PLT 260 07/01/2022   GLUCOSE 94 06/29/2022   CHOL 137 01/09/2020   TRIG 73.0 01/09/2020   HDL 53.10 01/09/2020   LDLCALC 70 01/09/2020   ALT 25 06/29/2022   AST 30 06/29/2022   NA 139 06/29/2022   K 4.0 06/29/2022   CL 106  06/29/2022   CREATININE 0.75 06/29/2022   BUN 12 06/29/2022   CO2 25 06/29/2022   TSH 0.99 01/09/2020   HGBA1C 5.2 09/20/2017    No results found.  Assessment & Plan:   Problem List Items Addressed This Visit     Abscess of right breast associated with lactation    Mastitis R - s/p surgery on Sept 5, 2023 - healing      Well adult exam - Primary     We discussed age appropriate health related issues, including available/recomended screening tests and vaccinations. Labs were ordered to be later reviewed . All questions were answered. We discussed one or more of the following - seat belt use, use of sunscreen/sun exposure exercise, fall risk reduction, second hand smoke exposure, firearm use and storage, seat belt use, a need for adhering to healthy diet and exercise. Labs were ordered.  All questions were answered. Take Ca and Vit D while breast feeding       Relevant Orders   CBC with Differential/Platelet   TSH   Urinalysis   CBC with Differential/Platelet   Lipid panel   Comprehensive metabolic panel      No orders of the defined types were placed in this encounter.     Follow-up: Return in about 1 year (around 07/22/2023) for Wellness Exam.  Walker Kehr, MD

## 2022-07-28 ENCOUNTER — Encounter: Payer: Self-pay | Admitting: Physician Assistant

## 2022-07-28 ENCOUNTER — Ambulatory Visit (INDEPENDENT_AMBULATORY_CARE_PROVIDER_SITE_OTHER): Payer: Managed Care, Other (non HMO) | Admitting: Physician Assistant

## 2022-07-28 ENCOUNTER — Other Ambulatory Visit: Payer: Self-pay

## 2022-07-28 VITALS — BP 112/72 | HR 91 | Temp 98.7°F | Ht 61.0 in | Wt 194.0 lb

## 2022-07-28 DIAGNOSIS — O9113 Abscess of breast associated with lactation: Secondary | ICD-10-CM

## 2022-07-28 DIAGNOSIS — Z09 Encounter for follow-up examination after completed treatment for conditions other than malignant neoplasm: Secondary | ICD-10-CM

## 2022-07-28 DIAGNOSIS — N611 Abscess of the breast and nipple: Secondary | ICD-10-CM

## 2022-07-28 NOTE — Progress Notes (Signed)
[facility] POST-OP OFFICE VISIT  07/28/2022  HPI: Melissa Perkins is a 26 y.o. female 28 days s/p incision and drainage of deep right breast abscess along with incisional breast biopsy with Dr Dahlia Byes   She is doing well Wound is ~90% healed Small area still draining milk; she is breast feeding No fever, chills  Vital signs: BP 112/72   Pulse 91   Temp 98.7 F (37.1 C) (Oral)   Ht 5\' 1"  (1.549 m)   Wt 194 lb (88 kg)   SpO2 98%   BMI 36.66 kg/m    Physical Exam: Constitutional: Well appearing female, NAD Skin: Freda Munro present as chaperone, to the 12 o'clock position just superior to the areola on the right breast there is a 1 x <0.5 x 0 cm wound, wound bed is healthy granulation tissue, there is a small amount of milk draining from the inferior edge, no erythema, non-tender  Assessment/Plan: This is a 26 y.o. female 28 days s/p incision and drainage of deep right breast abscess along with incisional breast biopsy with Dr Dahlia Byes    - Reviewed wound care recommendation; Continue superficial dressing as needed. Suspect the final portion of epithelization will take a little extra time do to active milk drainage  - She can follow up on as needed basis; She understands to call with questions/concerns  -- Edison Simon, PA-C [facility] 07/28/2022, 2:31 PM M-F: 7am - 4pm

## 2022-08-07 ENCOUNTER — Encounter: Payer: Self-pay | Admitting: Internal Medicine

## 2022-08-07 ENCOUNTER — Ambulatory Visit (INDEPENDENT_AMBULATORY_CARE_PROVIDER_SITE_OTHER): Payer: Managed Care, Other (non HMO) | Admitting: *Deleted

## 2022-08-07 ENCOUNTER — Other Ambulatory Visit (INDEPENDENT_AMBULATORY_CARE_PROVIDER_SITE_OTHER): Payer: Managed Care, Other (non HMO)

## 2022-08-07 DIAGNOSIS — Z0000 Encounter for general adult medical examination without abnormal findings: Secondary | ICD-10-CM | POA: Diagnosis not present

## 2022-08-07 DIAGNOSIS — Z111 Encounter for screening for respiratory tuberculosis: Secondary | ICD-10-CM

## 2022-08-07 LAB — COMPREHENSIVE METABOLIC PANEL
ALT: 16 U/L (ref 0–35)
AST: 19 U/L (ref 0–37)
Albumin: 4.3 g/dL (ref 3.5–5.2)
Alkaline Phosphatase: 98 U/L (ref 39–117)
BUN: 12 mg/dL (ref 6–23)
CO2: 21 mEq/L (ref 19–32)
Calcium: 9.2 mg/dL (ref 8.4–10.5)
Chloride: 105 mEq/L (ref 96–112)
Creatinine, Ser: 0.65 mg/dL (ref 0.40–1.20)
GFR: 122.02 mL/min (ref >60.00)
Glucose, Bld: 104 mg/dL — ABNORMAL HIGH (ref 70–99)
Potassium: 3.9 mEq/L (ref 3.5–5.1)
Sodium: 136 mEq/L (ref 135–145)
Total Bilirubin: 0.4 mg/dL (ref 0.2–1.2)
Total Protein: 7.8 g/dL (ref 6.0–8.3)

## 2022-08-07 LAB — LIPID PANEL
Cholesterol: 148 mg/dL (ref 0–200)
HDL: 65.8 mg/dL (ref >39.00)
LDL Cholesterol: 54 mg/dL (ref 0–99)
NonHDL: 82.22
Total CHOL/HDL Ratio: 2
Triglycerides: 142 mg/dL (ref 0.0–149.0)
VLDL: 28.4 mg/dL (ref 0.0–40.0)

## 2022-08-07 LAB — URINALYSIS
Bilirubin Urine: NEGATIVE
Hgb urine dipstick: NEGATIVE
Ketones, ur: NEGATIVE
Leukocytes,Ua: NEGATIVE
Nitrite: NEGATIVE
Specific Gravity, Urine: 1.015 (ref 1.000–1.030)
Total Protein, Urine: NEGATIVE
Urine Glucose: NEGATIVE
Urobilinogen, UA: 0.2 (ref 0.0–1.0)
pH: 5.5 (ref 5.0–8.0)

## 2022-08-07 LAB — TSH: TSH: 0.71 u[IU]/mL (ref 0.35–5.50)

## 2022-08-07 NOTE — Progress Notes (Cosign Needed Addendum)
TB skin test right arm. Notified pt to come back 48-72 hr for re-check  Medical screening examination/treatment/procedure(s) were performed by non-physician practitioner and as supervising physician I was immediately available for consultation/collaboration.  I agree with above. Jacinta Shoe, MD

## 2022-08-10 ENCOUNTER — Ambulatory Visit: Payer: Managed Care, Other (non HMO) | Admitting: *Deleted

## 2022-08-10 LAB — TB SKIN TEST
Induration: 0 mm
TB Skin Test: NEGATIVE

## 2022-08-10 LAB — CBC WITH DIFFERENTIAL/PLATELET
Basophils Absolute: 0.1 10*3/uL (ref 0.0–0.1)
Basophils Relative: 0.6 % (ref 0.0–3.0)
Eosinophils Absolute: 0.2 10*3/uL (ref 0.0–0.7)
Eosinophils Relative: 1.8 % (ref 0.0–5.0)
HCT: 38.4 % (ref 36.0–46.0)
Hemoglobin: 12.6 g/dL (ref 12.0–15.0)
Lymphocytes Relative: 25.2 % (ref 12.0–46.0)
Lymphs Abs: 2.5 10*3/uL (ref 0.7–4.0)
MCHC: 32.8 g/dL (ref 30.0–36.0)
MCV: 84.8 fl (ref 78.0–100.0)
Monocytes Absolute: 0.8 10*3/uL (ref 0.1–1.0)
Monocytes Relative: 7.6 % (ref 3.0–12.0)
Neutro Abs: 6.3 10*3/uL (ref 1.4–7.7)
Neutrophils Relative %: 64.5 % (ref 43.0–77.0)
Platelets: 264 10*3/uL (ref 150.0–400.0)
RBC: 4.53 Mil/uL (ref 3.87–5.11)
RDW: 17.2 % — ABNORMAL HIGH (ref 11.5–15.5)
Toxic Granulation: 38.4
Vacuolated Neutrophils: 12.6
WBC: 9.8 10*3/uL (ref 4.0–10.5)

## 2022-08-10 NOTE — Progress Notes (Addendum)
Pt came in the office for TB reading results--negative  Medical screening examination/treatment/procedure(s) were performed by non-physician practitioner and as supervising physician I was immediately available for consultation/collaboration.  I agree with above. Lew Dawes, MD

## 2023-01-21 ENCOUNTER — Encounter: Payer: Self-pay | Admitting: Internal Medicine

## 2023-01-21 ENCOUNTER — Ambulatory Visit: Payer: Managed Care, Other (non HMO) | Admitting: Internal Medicine

## 2023-01-21 VITALS — BP 98/66 | HR 88 | Temp 98.7°F | Ht 61.0 in | Wt 196.0 lb

## 2023-01-21 DIAGNOSIS — Z0000 Encounter for general adult medical examination without abnormal findings: Secondary | ICD-10-CM | POA: Diagnosis not present

## 2023-01-21 NOTE — Assessment & Plan Note (Signed)
  We discussed age appropriate health related issues, including available/recomended screening tests and vaccinations. Labs were ordered to be later reviewed . All questions were answered. We discussed one or more of the following - seat belt use, use of sunscreen/sun exposure exercise, fall risk reduction, second hand smoke exposure, firearm use and storage, seat belt use, a need for adhering to healthy diet and exercise. Son [place] b 2023

## 2023-01-21 NOTE — Progress Notes (Signed)
Subjective:  Patient ID: Melissa Perkins, female    DOB: 1996/07/06  Age: 27 y.o. MRN: [id number]  CC: No chief complaint on file.   HPI Melissa Perkins presents for a well exam  Outpatient Medications Prior to Visit  Medication Sig Dispense Refill   ferrous sulfate 325 (65 FE) MG tablet Take 325 mg by mouth daily with breakfast.     Prenatal Vit-Fe Fumarate-FA (PNV PRENATAL PLUS MULTIVITAMIN) 27-1 MG TABS Take 1 tablet by mouth daily. 30 tablet 0   No facility-administered medications prior to visit.    ROS: Review of Systems  Constitutional:  Negative for activity change, appetite change, chills, diaphoresis, fatigue, fever and unexpected weight change.  HENT:  Negative for congestion, dental problem, ear pain, hearing loss, mouth sores, postnasal drip, sinus pressure, sneezing, sore throat and voice change.   Eyes:  Negative for pain and visual disturbance.  Respiratory:  Negative for cough, chest tightness, wheezing and stridor.   Cardiovascular:  Negative for chest pain, palpitations and leg swelling.  Gastrointestinal:  Negative for abdominal distention, abdominal pain, blood in stool, nausea, rectal pain and vomiting.  Genitourinary:  Negative for decreased urine volume, difficulty urinating, dysuria, frequency, hematuria, menstrual problem, vaginal bleeding, vaginal discharge and vaginal pain.  Musculoskeletal:  Positive for arthralgias. Negative for back pain, gait problem, joint swelling and neck pain.  Skin:  Negative for color change, pallor, rash and wound.  Neurological:  Negative for dizziness, tremors, syncope, speech difficulty, weakness, light-headedness, numbness and headaches.  Hematological:  Negative for adenopathy.  Psychiatric/Behavioral:  Negative for behavioral problems, confusion, decreased concentration, dysphoric mood, hallucinations, sleep disturbance and suicidal ideas. The patient is not nervous/anxious and is not hyperactive.     Objective:  BP  98/66 (BP Location: Left Arm, Patient Position: Sitting, Cuff Size: Normal)   Pulse 88   Temp 98.7 F (37.1 C) (Oral)   Ht 5\' 1"  (1.549 m)   Wt 196 lb (88.9 kg)   SpO2 96%   BMI 37.03 kg/m   BP Readings from Last 3 Encounters:  01/21/23 98/66  07/28/22 112/72  07/21/22 (!) 110/58    Wt Readings from Last 3 Encounters:  01/21/23 196 lb (88.9 kg)  07/28/22 194 lb (88 kg)  07/21/22 195 lb 12.8 oz (88.8 kg)    Physical Exam Constitutional:      General: She is not in acute distress.    Appearance: She is well-developed. She is obese.  HENT:     Head: Normocephalic.     Right Ear: External ear normal.     Left Ear: External ear normal.     Nose: Nose normal.  Eyes:     General:        Right eye: No discharge.        Left eye: No discharge.     Conjunctiva/sclera: Conjunctivae normal.     Pupils: Pupils are equal, round, and reactive to light.  Neck:     Thyroid: No thyromegaly.     Vascular: No JVD.     Trachea: No tracheal deviation.  Cardiovascular:     Rate and Rhythm: Normal rate and regular rhythm.     Heart sounds: Normal heart sounds.  Pulmonary:     Effort: No respiratory distress.     Breath sounds: No stridor. No wheezing.  Abdominal:     General: Bowel sounds are normal. There is no distension.     Palpations: Abdomen is soft. There is no mass.  Tenderness: There is no abdominal tenderness. There is no guarding or rebound.  Musculoskeletal:        General: No tenderness.     Cervical back: Normal range of motion and neck supple. No rigidity.  Lymphadenopathy:     Cervical: No cervical adenopathy.  Skin:    Findings: No erythema or rash.  Neurological:     Mental Status: She is oriented to person, place, and time.     Cranial Nerves: No cranial nerve deficit.     Motor: No abnormal muscle tone.     Coordination: Coordination normal.     Deep Tendon Reflexes: Reflexes normal.  Psychiatric:        Behavior: Behavior normal.        Thought  Content: Thought content normal.        Judgment: Judgment normal.     Lab Results  Component Value Date   WBC 9.8 08/07/2022   HGB 12.6 08/07/2022   HCT 38.4 08/07/2022   PLT 264.0 08/07/2022   GLUCOSE 104 (H) 08/07/2022   CHOL 148 08/07/2022   TRIG 142.0 08/07/2022   HDL 65.80 08/07/2022   LDLCALC 54 08/07/2022   ALT 16 08/07/2022   AST 19 08/07/2022   NA 136 08/07/2022   K 3.9 08/07/2022   CL 105 08/07/2022   CREATININE 0.65 08/07/2022   BUN 12 08/07/2022   CO2 21 08/07/2022   TSH 0.71 08/07/2022   HGBA1C 5.2 09/20/2017    No results found.  Assessment & Plan:   Problem List Items Addressed This Visit       Other   Well adult exam - Primary     We discussed age appropriate health related issues, including available/recomended screening tests and vaccinations. Labs were ordered to be later reviewed . All questions were answered. We discussed one or more of the following - seat belt use, use of sunscreen/sun exposure exercise, fall risk reduction, second hand smoke exposure, firearm use and storage, seat belt use, a need for adhering to healthy diet and exercise. [place] b 2023         No orders of the defined types were placed in this encounter.     Follow-up: Return in about 1 year (around 01/21/2024) for Wellness Exam.  Sonda PrimesAlex Jessicia Napolitano, MD

## 2023-02-18 ENCOUNTER — Telehealth: Payer: Managed Care, Other (non HMO) | Admitting: Physician Assistant

## 2023-02-18 DIAGNOSIS — R21 Rash and other nonspecific skin eruption: Secondary | ICD-10-CM

## 2023-02-18 MED ORDER — CLOTRIMAZOLE-BETAMETHASONE 1-0.05 % EX CREA: 1.0000 | TOPICAL_CREAM | Freq: Every day | CUTANEOUS | 0 refills | Status: AC

## 2023-02-18 NOTE — Progress Notes (Signed)
E Visit for Rash  We are sorry that you are not feeling well. Here is how we plan to help!  I am starting you on a topical cream that is a combination of a topical steroid and an antifungal. Some areas seem eczematous which will be helped by the steroid. But some appear to possibly be a skin yeast/fungal, so the antifungal will cover for that. If not resolving, you need an in-person evaluation.    HOME CARE:  Take cool showers and avoid direct sunlight. Apply cool compress or wet dressings. Take a bath in an oatmeal bath.  Sprinkle content of one Aveeno packet under running faucet with comfortably warm water.  Bathe for 15-20 minutes, 1-2 times daily.  Pat dry with a towel. Do not rub the rash. Use hydrocortisone cream. Take an antihistamine like Benadryl for widespread rashes that itch.  The adult dose of Benadryl is 25-50 mg by mouth 4 times daily. Caution:  This type of medication may cause sleepiness.  Do not drink alcohol, drive, or operate dangerous machinery while taking antihistamines.  Do not take these medications if you have prostate enlargement.  Read package instructions thoroughly on all medications that you take.  GET HELP RIGHT AWAY IF:  Symptoms don't go away after treatment. Severe itching that persists. If you rash spreads or swells. If you rash begins to smell. If it blisters and opens or develops a yellow-brown crust. You develop a fever. You have a sore throat. You become short of breath.  MAKE SURE YOU:  Understand these instructions. Will watch your condition. Will get help right away if you are not doing well or get worse.  Thank you for choosing an e-visit.  Your e-visit answers were reviewed by a board certified advanced clinical practitioner to complete your personal care plan. Depending upon the condition, your plan could have included both over the counter or prescription medications.  Please review your pharmacy choice. Make sure the pharmacy is  open so you can pick up prescription now. If there is a problem, you may contact your provider through [employer] and have the prescription routed to another pharmacy.  Your safety is important to [place]. If you have drug allergies check your prescription carefully.   For the next 24 hours you can use MyChart to ask questions about today's visit, request a non-urgent call back, or ask for a work or school excuse. You will get an email in the next two days asking about your experience. I hope that your e-visit has been valuable and will speed your recovery.

## 2023-02-18 NOTE — Progress Notes (Signed)
I have spent 5 minutes in review of e-visit questionnaire, review and updating patient chart, medical decision making and response to patient.   Jamaica Inthavong Cody Kimerly Rowand, PA-C    

## 2023-04-19 ENCOUNTER — Telehealth: Payer: Managed Care, Other (non HMO) | Admitting: Physician Assistant

## 2023-04-19 DIAGNOSIS — B9689 Other specified bacterial agents as the cause of diseases classified elsewhere: Secondary | ICD-10-CM | POA: Diagnosis not present

## 2023-04-19 DIAGNOSIS — J019 Acute sinusitis, unspecified: Secondary | ICD-10-CM | POA: Diagnosis not present

## 2023-04-19 MED ORDER — DOXYCYCLINE HYCLATE 100 MG PO TABS: 100.0000 mg | ORAL_TABLET | Freq: Two times a day (BID) | ORAL | 0 refills | Status: AC

## 2023-04-19 NOTE — Progress Notes (Signed)
E-Visit for Sinus Problems  We are sorry that you are not feeling well.  Here is how we plan to help!  Based on what you have shared with me it looks like you have sinusitis.  Sinusitis is inflammation and infection in the sinus cavities of the head.  Based on your presentation I believe you most likely have Acute Bacterial Sinusitis.  This is an infection caused by bacteria and is treated with antibiotics. I have prescribed Doxycycline 100mg by mouth twice a day for 10 days. You may use an oral decongestant such as Mucinex D or if you have glaucoma or high blood pressure use plain Mucinex. Saline nasal spray help and can safely be used as often as needed for congestion.  If you develop worsening sinus pain, fever or notice severe headache and vision changes, or if symptoms are not better after completion of antibiotic, please schedule an appointment with a health care provider.    Sinus infections are not as easily transmitted as other respiratory infection, however we still recommend that you avoid close contact with loved ones, especially the very young and elderly.  Remember to wash your hands thoroughly throughout the day as this is the number one way to prevent the spread of infection!  Home Care: Only take medications as instructed by your medical team. Complete the entire course of an antibiotic. Do not take these medications with alcohol. A steam or ultrasonic humidifier can help congestion.  You can place a towel over your head and breathe in the steam from hot water coming from a faucet. Avoid close contacts especially the very young and the elderly. Cover your mouth when you cough or sneeze. Always remember to wash your hands.  Get Help Right Away If: You develop worsening fever or sinus pain. You develop a severe head ache or visual changes. Your symptoms persist after you have completed your treatment plan.  Make sure you Understand these instructions. Will watch your  condition. Will get help right away if you are not doing well or get worse.  Thank you for choosing an e-visit.  Your e-visit answers were reviewed by a board certified advanced clinical practitioner to complete your personal care plan. Depending upon the condition, your plan could have included both over the counter or prescription medications.  Please review your pharmacy choice. Make sure the pharmacy is open so you can pick up prescription now. If there is a problem, you may contact your provider through MyChart messaging and have the prescription routed to another pharmacy.  Your safety is important to us. If you have drug allergies check your prescription carefully.   For the next 24 hours you can use MyChart to ask questions about today's visit, request a non-urgent call back, or ask for a work or school excuse. You will get an email in the next two days asking about your experience. I hope that your e-visit has been valuable and will speed your recovery.  I have spent 5 minutes in review of e-visit questionnaire, review and updating patient chart, medical decision making and response to patient.   Else Habermann M Zeno Hickel, PA-C  

## 2023-06-10 ENCOUNTER — Encounter (INDEPENDENT_AMBULATORY_CARE_PROVIDER_SITE_OTHER): Payer: Self-pay

## 2023-07-26 ENCOUNTER — Encounter: Payer: Managed Care, Other (non HMO) | Admitting: Internal Medicine
